# Patient Record
Sex: Male | Born: 1970 | Hispanic: Yes | Marital: Married | Smoking: Never smoker
Health system: Southern US, Community
[De-identification: ages and names within clinical notes are randomized; demographics above are authoritative.]

## PROBLEM LIST (undated history)

## (undated) DIAGNOSIS — J189 Pneumonia, unspecified organism: Secondary | ICD-10-CM

## (undated) DIAGNOSIS — E7800 Pure hypercholesterolemia, unspecified: Secondary | ICD-10-CM

## (undated) HISTORY — PX: NO PAST SURGERIES: SHX2092

---

## 2020-09-02 DIAGNOSIS — U071 COVID-19: Secondary | ICD-10-CM

## 2020-09-02 HISTORY — DX: COVID-19: U07.1

## 2021-09-25 ENCOUNTER — Emergency Department (HOSPITAL_COMMUNITY): Payer: No Typology Code available for payment source

## 2021-09-25 ENCOUNTER — Encounter (HOSPITAL_COMMUNITY): Payer: Self-pay

## 2021-09-25 ENCOUNTER — Other Ambulatory Visit: Payer: Self-pay

## 2021-09-25 ENCOUNTER — Observation Stay (HOSPITAL_COMMUNITY)
Admission: EM | Admit: 2021-09-25 | Discharge: 2021-09-27 | Disposition: A | Discharge status: Home / Self Care | Payer: No Typology Code available for payment source | Attending: General Surgery | Admitting: General Surgery

## 2021-09-25 DIAGNOSIS — S3991XA Unspecified injury of abdomen, initial encounter: Secondary | ICD-10-CM | POA: Insufficient documentation

## 2021-09-25 DIAGNOSIS — S2242XA Multiple fractures of ribs, left side, initial encounter for closed fracture: Secondary | ICD-10-CM | POA: Diagnosis not present

## 2021-09-25 DIAGNOSIS — S299XXA Unspecified injury of thorax, initial encounter: Secondary | ICD-10-CM | POA: Diagnosis present

## 2021-09-25 DIAGNOSIS — S82141A Displaced bicondylar fracture of right tibia, initial encounter for closed fracture: Secondary | ICD-10-CM | POA: Diagnosis not present

## 2021-09-25 DIAGNOSIS — S50312A Abrasion of left elbow, initial encounter: Secondary | ICD-10-CM | POA: Insufficient documentation

## 2021-09-25 DIAGNOSIS — Z20822 Contact with and (suspected) exposure to covid-19: Secondary | ICD-10-CM | POA: Diagnosis not present

## 2021-09-25 DIAGNOSIS — S2249XA Multiple fractures of ribs, unspecified side, initial encounter for closed fracture: Secondary | ICD-10-CM | POA: Diagnosis present

## 2021-09-25 DIAGNOSIS — S46812A Strain of other muscles, fascia and tendons at shoulder and upper arm level, left arm, initial encounter: Secondary | ICD-10-CM | POA: Insufficient documentation

## 2021-09-25 DIAGNOSIS — W132XXA Fall from, out of or through roof, initial encounter: Secondary | ICD-10-CM | POA: Insufficient documentation

## 2021-09-25 DIAGNOSIS — T1490XA Injury, unspecified, initial encounter: Secondary | ICD-10-CM

## 2021-09-25 DIAGNOSIS — Y9389 Activity, other specified: Secondary | ICD-10-CM | POA: Insufficient documentation

## 2021-09-25 DIAGNOSIS — Y9289 Other specified places as the place of occurrence of the external cause: Secondary | ICD-10-CM | POA: Diagnosis not present

## 2021-09-25 DIAGNOSIS — Y990 Civilian activity done for income or pay: Secondary | ICD-10-CM | POA: Diagnosis not present

## 2021-09-25 DIAGNOSIS — M25569 Pain in unspecified knee: Secondary | ICD-10-CM

## 2021-09-25 LAB — I-STAT CHEM 8, ED
BUN: 17 mg/dL (ref 6–20)
Calcium, Ion: 1.02 mmol/L — ABNORMAL LOW (ref 1.15–1.40)
Chloride: 102 mmol/L (ref 98–111)
Creatinine, Ser: 1.1 mg/dL (ref 0.61–1.24)
Glucose, Bld: 133 mg/dL — ABNORMAL HIGH (ref 70–99)
HCT: 44 % (ref 39.0–52.0)
Hemoglobin: 15 g/dL (ref 13.0–17.0)
Potassium: 3.2 mmol/L — ABNORMAL LOW (ref 3.5–5.1)
Sodium: 138 mmol/L (ref 135–145)
TCO2: 25 mmol/L (ref 22–32)

## 2021-09-25 LAB — CBC
HCT: 44.2 % (ref 39.0–52.0)
Hemoglobin: 14.7 g/dL (ref 13.0–17.0)
MCH: 29.5 pg (ref 26.0–34.0)
MCHC: 33.3 g/dL (ref 30.0–36.0)
MCV: 88.8 fL (ref 80.0–100.0)
Platelets: 269 10*3/uL (ref 150–400)
RBC: 4.98 MIL/uL (ref 4.22–5.81)
RDW: 12.8 % (ref 11.5–15.5)
WBC: 14.7 10*3/uL — ABNORMAL HIGH (ref 4.0–10.5)
nRBC: 0 % (ref 0.0–0.2)

## 2021-09-25 LAB — RESP PANEL BY RT-PCR (FLU A&B, COVID) ARPGX2
Influenza A by PCR: NEGATIVE
Influenza B by PCR: NEGATIVE
SARS Coronavirus 2 by RT PCR: NEGATIVE

## 2021-09-25 IMAGING — DX DG SHOULDER 2+V*L*
3 series · 3 of 3 positions shown · non-contrast
Comparison: None.

CLINICAL DATA: Left shoulder injury

EXAM:
LEFT SHOULDER - 2+ VIEW

[shoulder obl (1 of 2)]
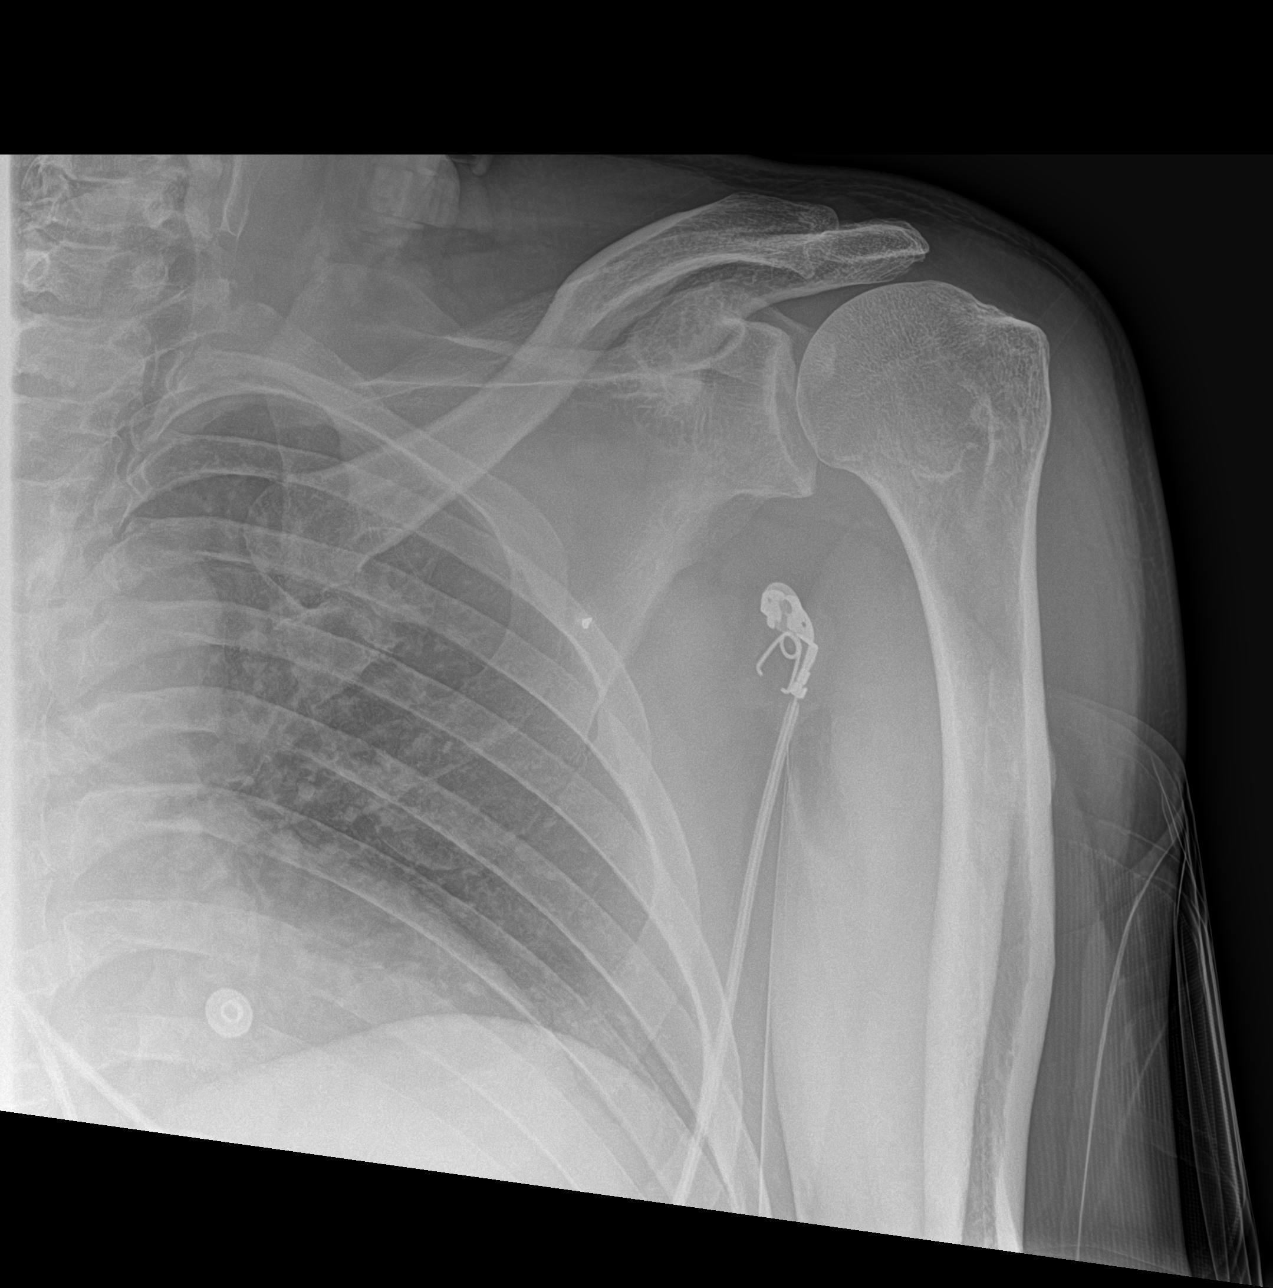

[shoulder ap]
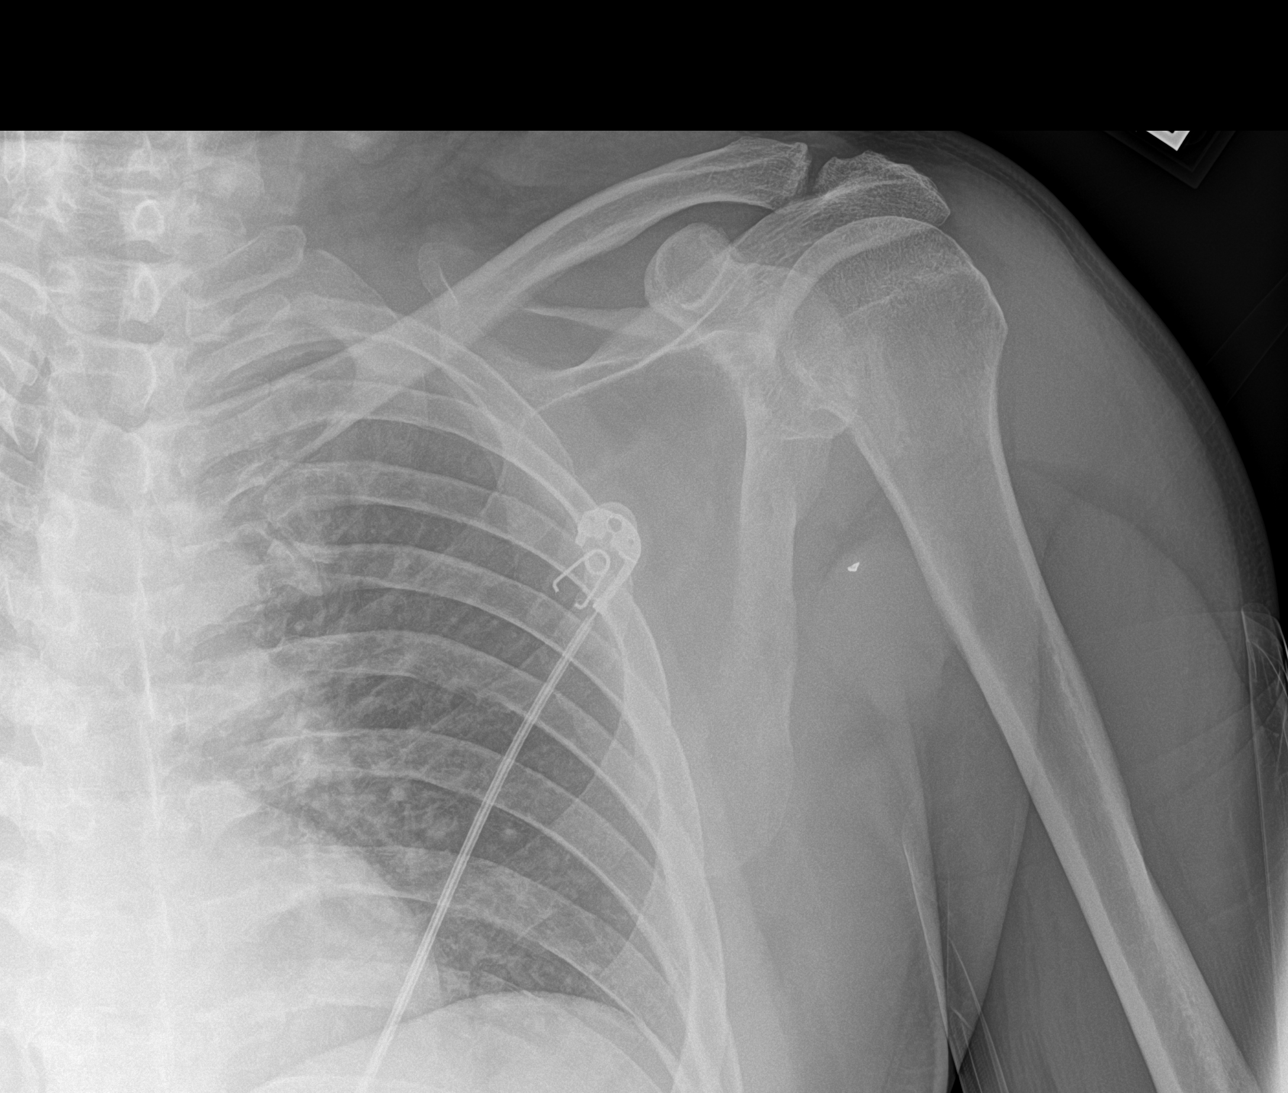

[shoulder obl (2 of 2)]
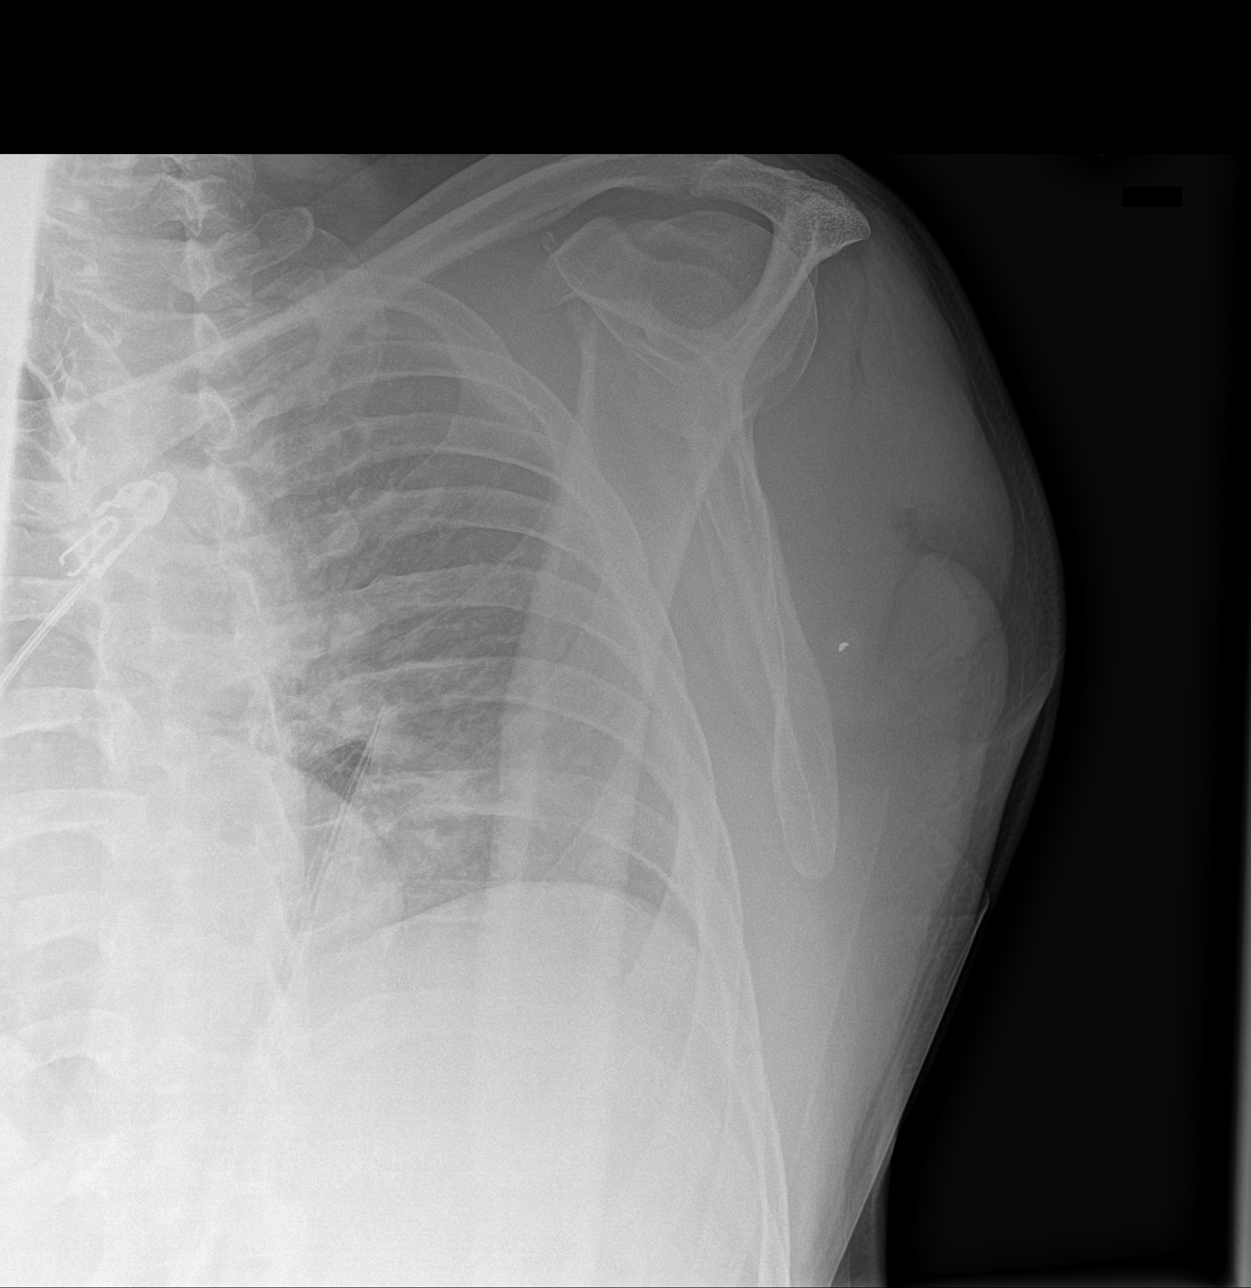

[3 of 3 positions shown; findings below may reference images not displayed]

FINDINGS: There is no evidence of fracture or dislocation. There is no
evidence of arthropathy or other focal bone abnormality. Soft
tissues are unremarkable.
IMPRESSION: No acute osseous abnormality identified.

## 2021-09-25 IMAGING — CT CT HEAD W/O CM
4 series · 16 of 47 positions shown, 18 images · non-contrast
Comparison: None.

CLINICAL DATA: Status post fall from 20 feet.



[Series 3: head without · axial · non-contrast · 0.45mm/px · z∈[-478,-348]mm · 7 of 36 slices shown, 9 images]
[im 5/36  brain]
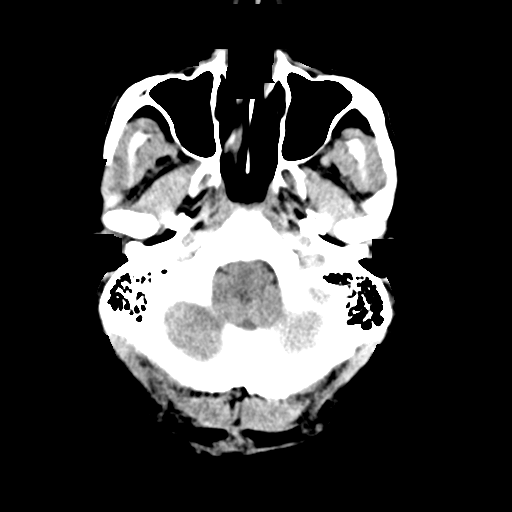
[im 5/36  bone]
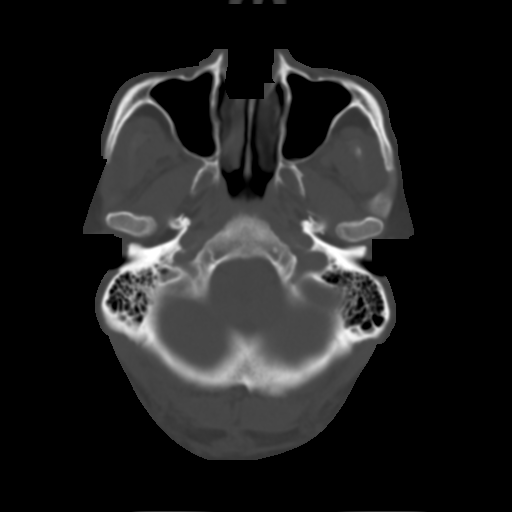
[im 9/36  brain]
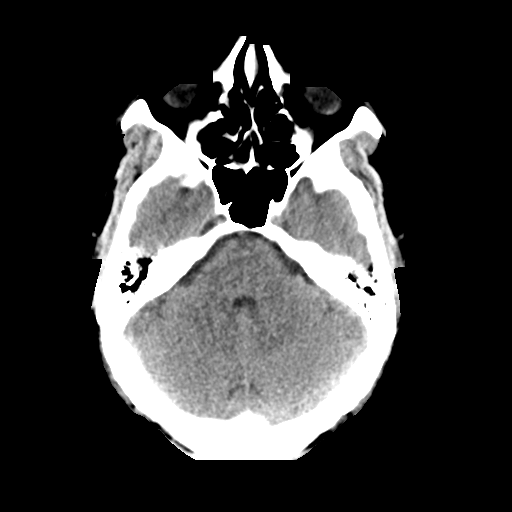
[im 14/36  brain]
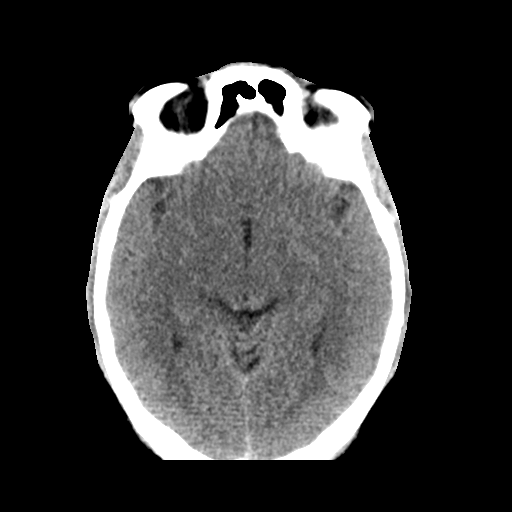
[im 18/36  brain]
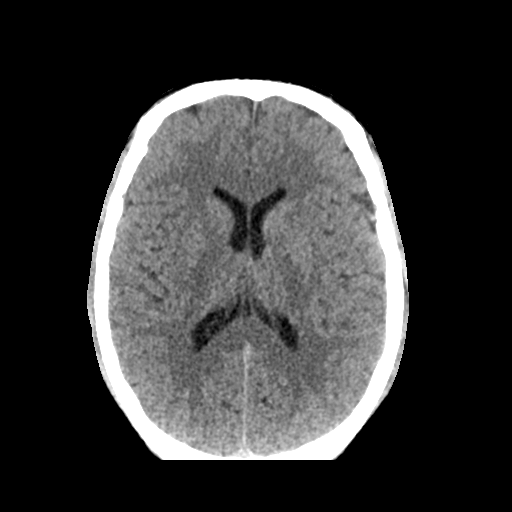
[im 22/36  brain]
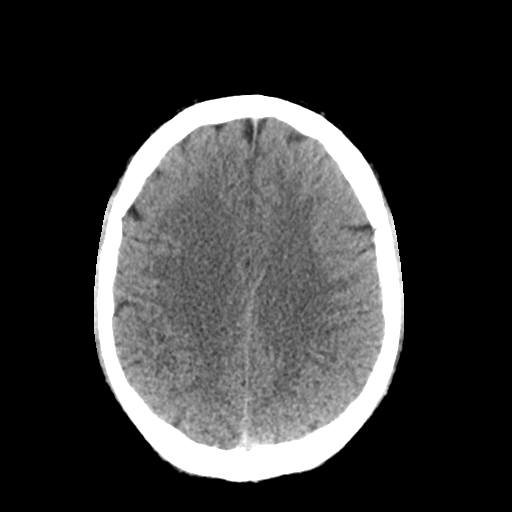
[im 22/36  bone]
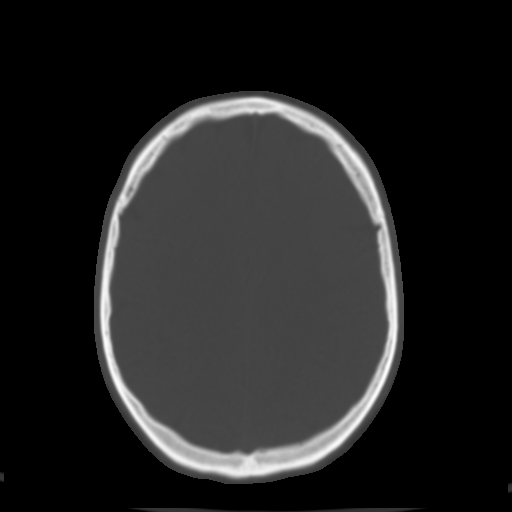
[im 27/36  brain]
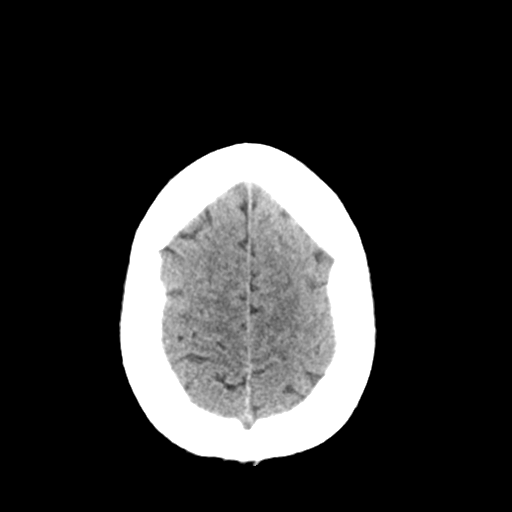
[im 31/36  brain]
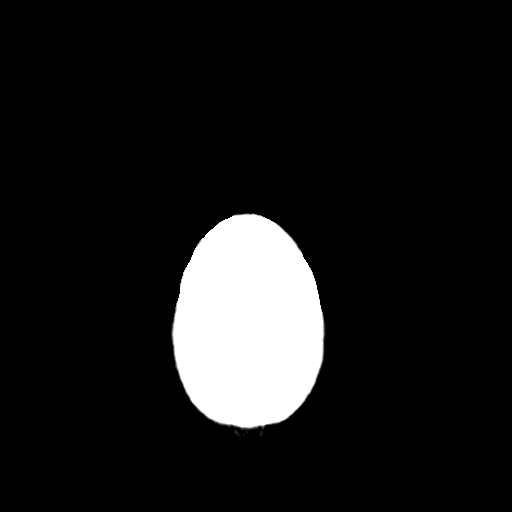

[Series 4: head bone · axial · 0.45mm/px · z∈[-482,-446]mm · 3 of 89 slices shown]
[im 9/89  bone]
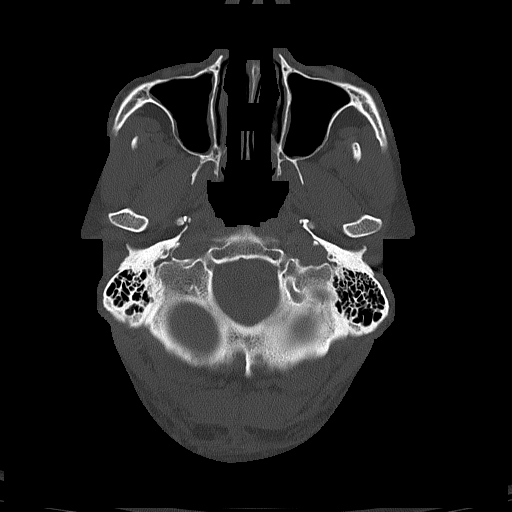
[im 18/89  bone]
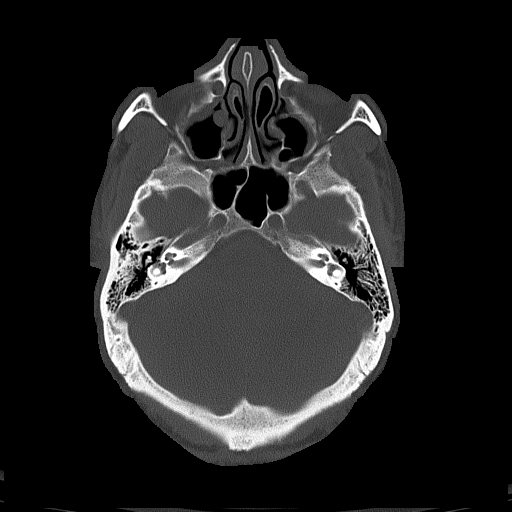
[im 27/89  bone]
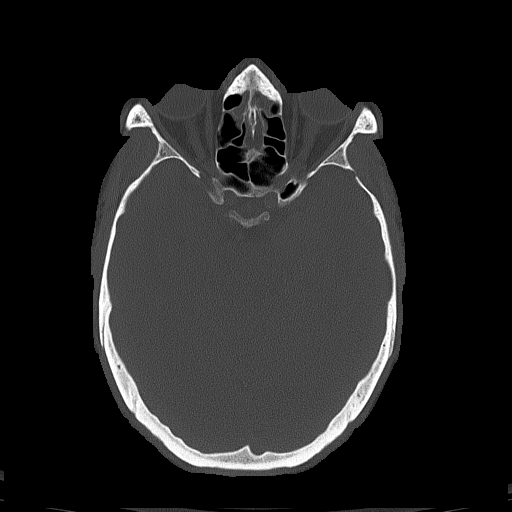

[Series 5: head without cor · coronal · non-contrast · 0.34mm/px · 3 of 72 slices shown]
[im 24/72  brain]
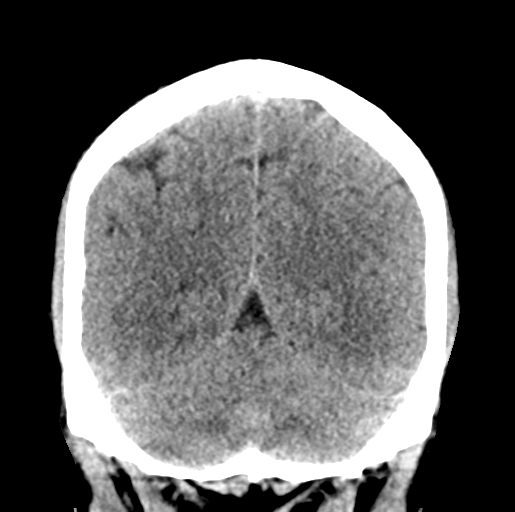
[im 32/72  brain]
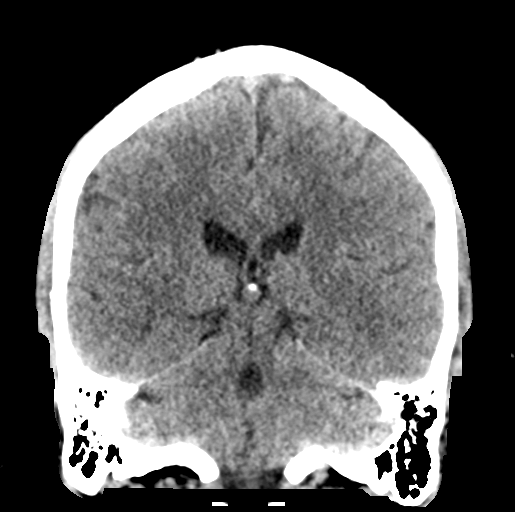
[im 40/72  brain]
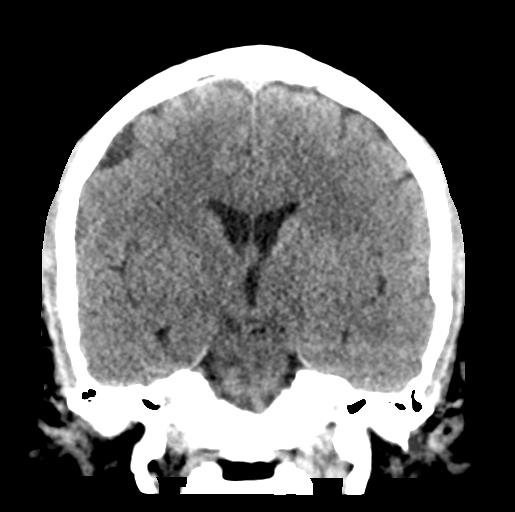

[Series 6: head without sag · sagittal · non-contrast · 0.34mm/px · 3 of 67 slices shown]
[im 23/67  brain]
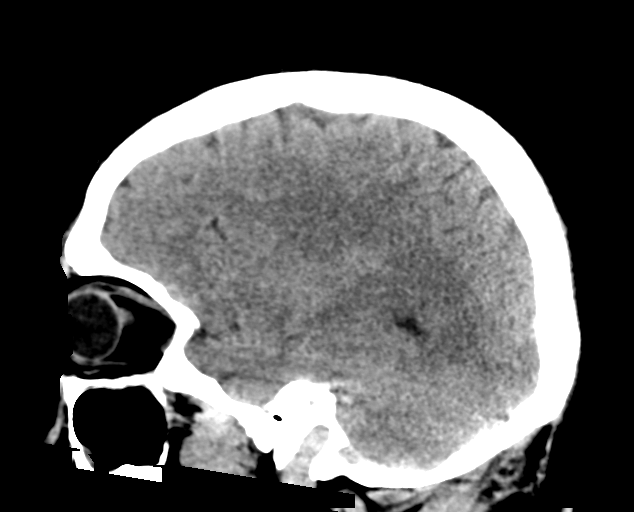
[im 34/67  brain]
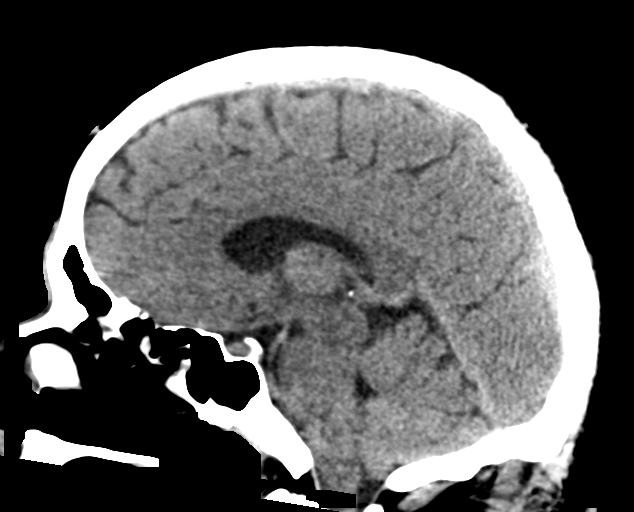
[im 45/67  brain]
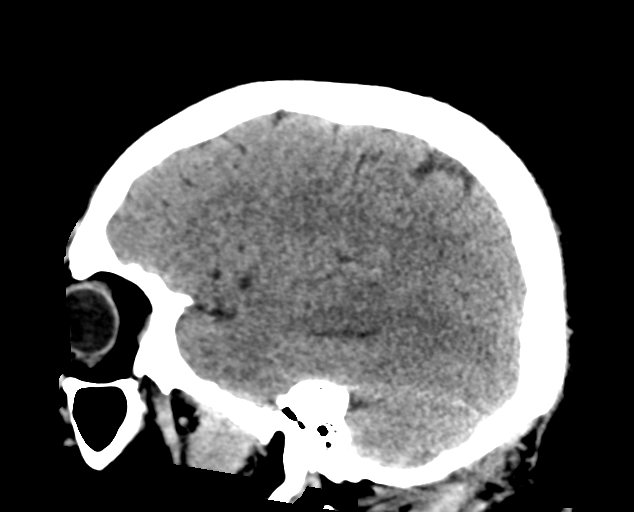

[16 of 47 positions shown; findings below may reference images not displayed]

FINDINGS: CT HEAD FINDINGS

Brain: No evidence of acute infarction, hemorrhage, hydrocephalus,
extra-axial collection or mass lesion/mass effect.

Vascular: No hyperdense vessel or unexpected calcification.

Skull: No osseous abnormality.

Sinuses/Orbits: Mucosal thickening of bilateral maxillary sinuses
and ethmoid sinuses. Visualized mastoid sinuses are clear.
Visualized orbits demonstrate no focal abnormality.

Other: None

CT CERVICAL SPINE FINDINGS

Alignment: Normal.

Skull base and vertebrae: No acute fracture. No primary bone lesion
or focal pathologic process.

Soft tissues and spinal canal: No prevertebral fluid or swelling. No
visible canal hematoma.

Disc levels: Degenerative disease with disc height loss at C4-5,
C5-6 and C6-7 with bilateral uncovertebral degenerative changes and
foraminal narrowing.

Upper chest: Lung apices are clear.

Other: No fluid collection or hematoma.
IMPRESSION: 1. No acute intracranial pathology.
2.  No acute osseous injury of the cervical spine.

## 2021-09-25 IMAGING — CT CT CHEST-ABD-PELV W/ CM
2 of 5 series · 12 of 36 positions shown, 14 images · IV contrast (APPLIED)
Comparison: None.

CLINICAL DATA: Abdominal trauma, blunt abdominal trauma in a
51-year-old male. Fall 20 feet by report.

EXAM:
CT CHEST, ABDOMEN, AND PELVIS WITH CONTRAST
TECHNIQUE: Multidetector CT imaging of the chest, abdomen and pelvis was
performed following the standard protocol during bolus
administration of intravenous contrast.

[Series 3: cap 5.0 i31f 2 · axial · 0.82mm/px · z∈[+786,+1356]mm · 9 of 144 slices shown, 11 images]
[im 15/144  mediastinal]
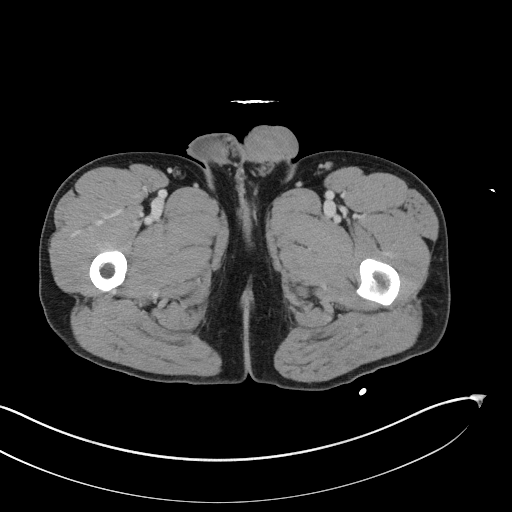
[im 15/144  bone]
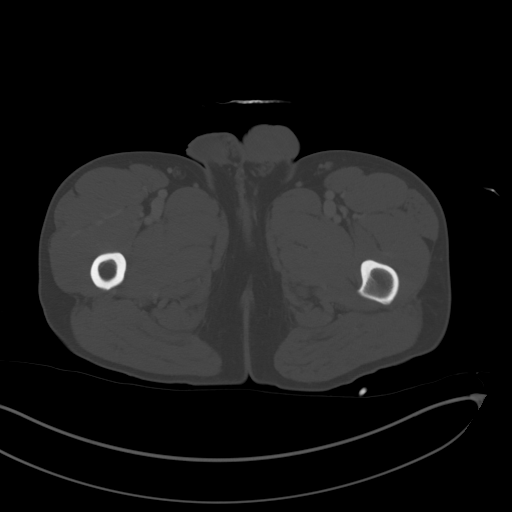
[im 29/144  mediastinal]
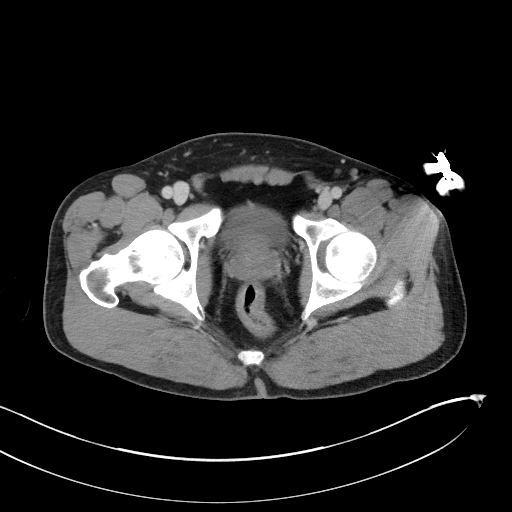
[im 43/144  mediastinal]
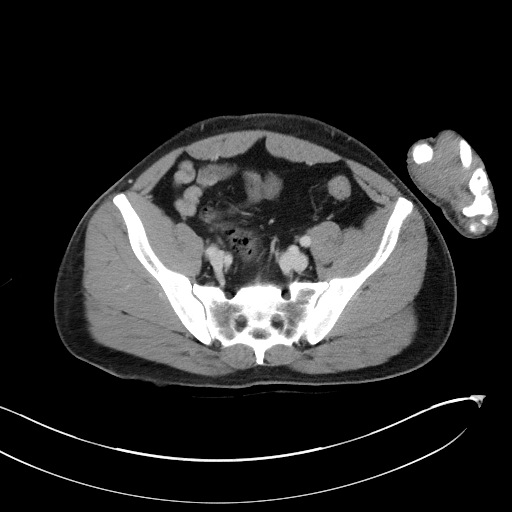
[im 58/144  mediastinal]
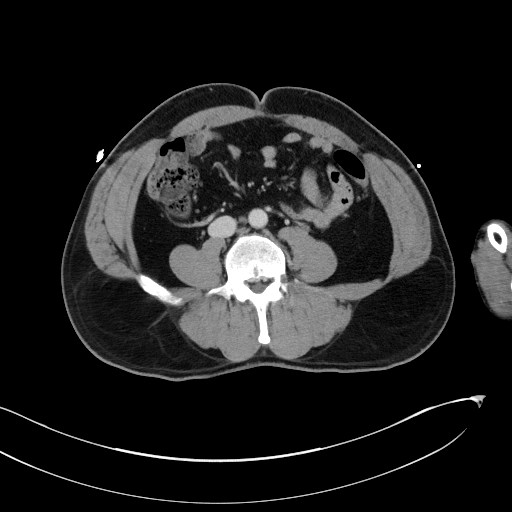
[im 72/144  mediastinal]
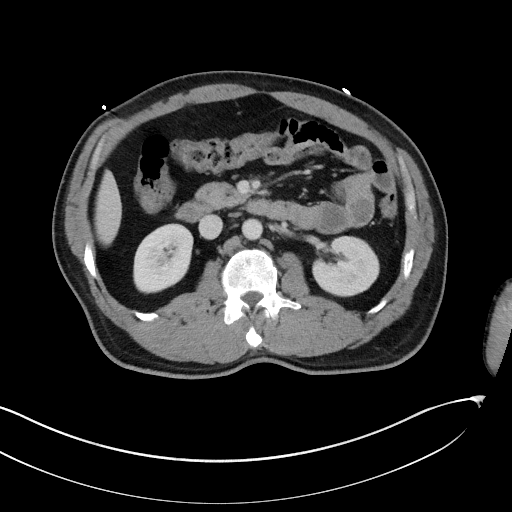
[im 86/144  mediastinal]
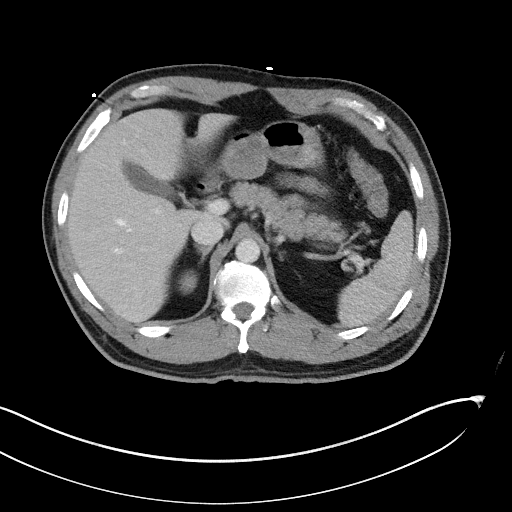
[im 101/144  mediastinal]
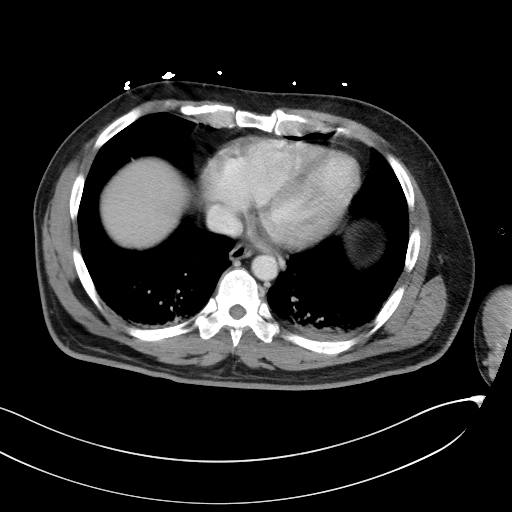
[im 115/144  mediastinal]
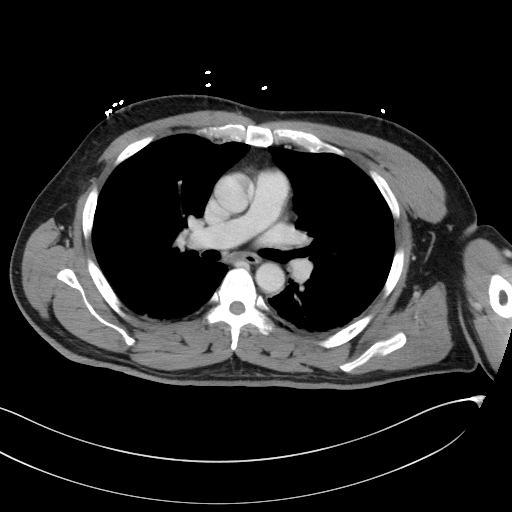
[im 129/144  mediastinal]
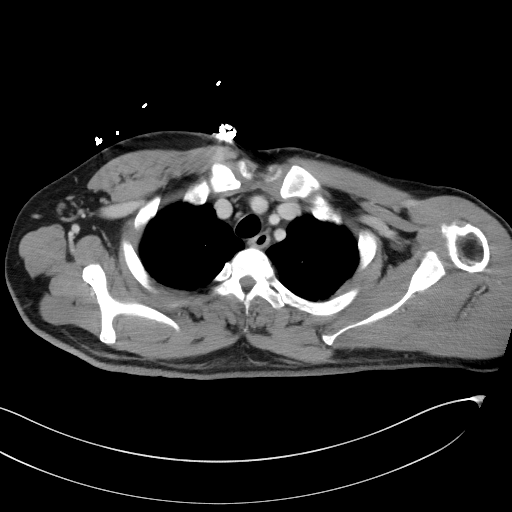
[im 129/144  bone]
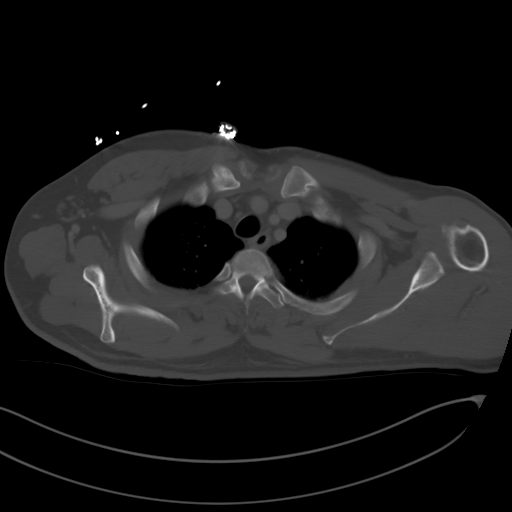

[Series 6: coronal · coronal · 0.71mm/px · 3 of 151 slices shown]
[im 31/151  mediastinal]
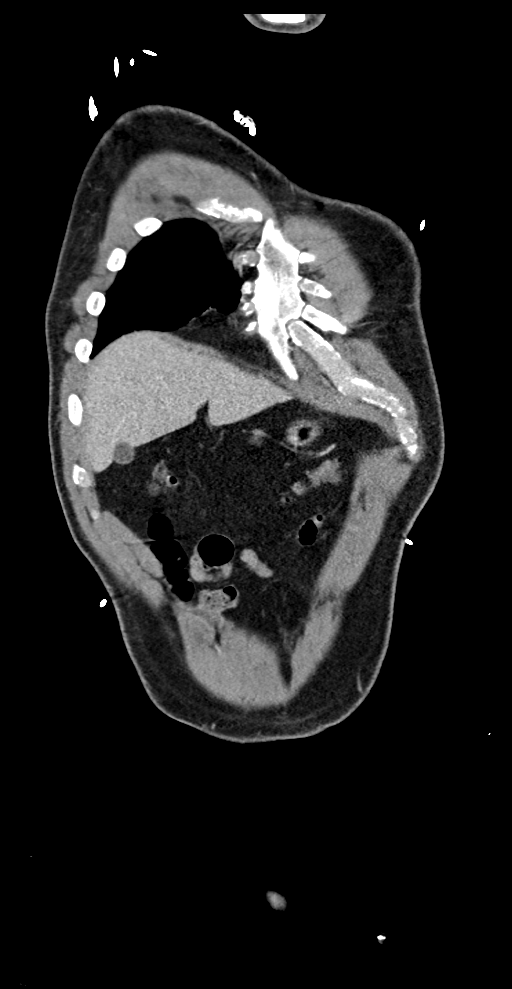
[im 61/151  mediastinal]
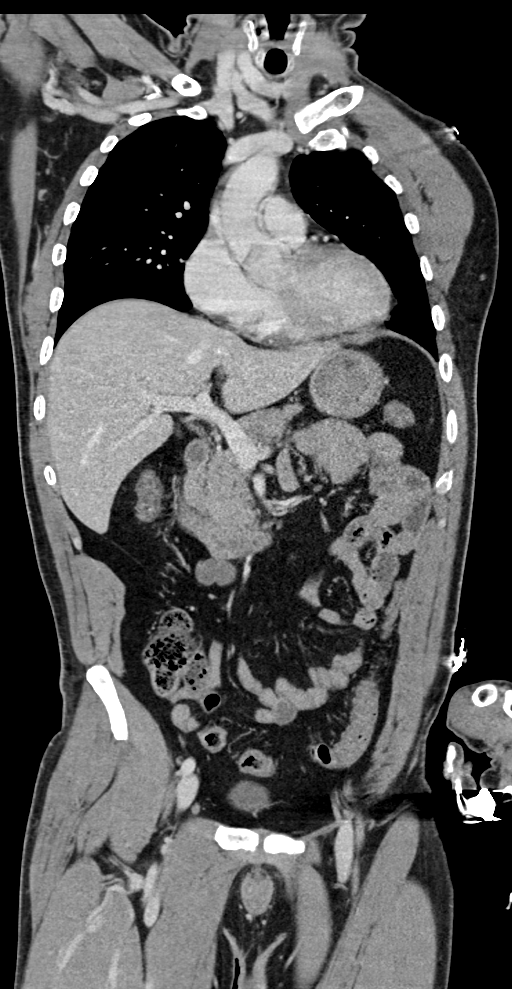
[im 91/151  mediastinal]
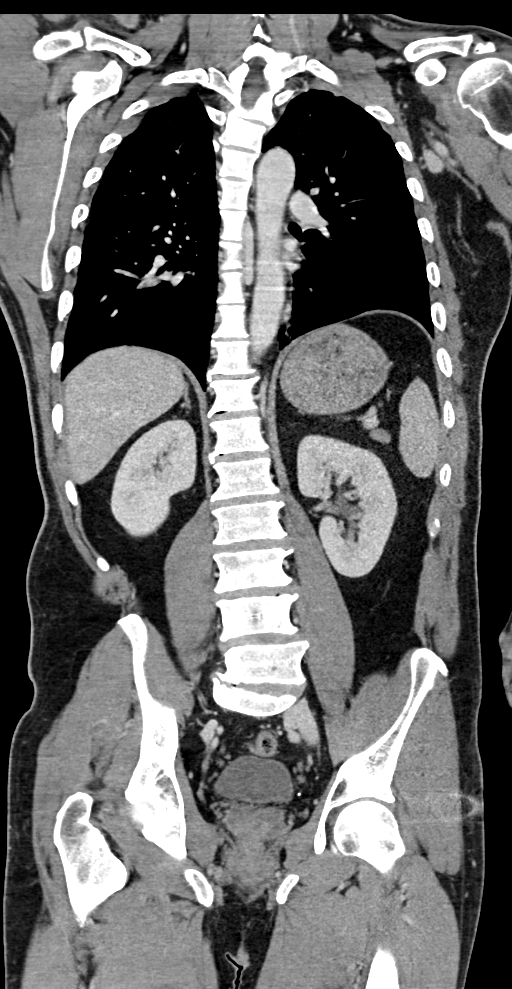

[12 of 36 positions shown; findings below may reference images not displayed]

RADIATION DOSE REDUCTION: This exam was performed according to the
departmental dose-optimization program which includes automated
exposure control, adjustment of the mA and/or kV according to
patient size and/or use of iterative reconstruction technique.

CONTRAST:  100mL OMNIPAQUE IOHEXOL 350 MG/ML SOLN
FINDINGS: CT CHEST FINDINGS

Cardiovascular: Heart size is normal without pericardial effusion.
Aortic caliber is normal. Contour of the thoracic aorta is smooth.
No periaortic stranding.

Central pulmonary vasculature unremarkable on venous phase.

Mediastinum/Nodes: Thoracic inlet structures are normal. No
mediastinal hematoma. No adenopathy in the chest.

Lungs/Pleura: Tiny LEFT anterior and medial pneumothorax. No
consolidation or evidence of pleural effusion. Airways are patent.
Minimal volume loss at the lung bases

Musculoskeletal: See below for full musculoskeletal details. No
substantial body wall contusion.

CT ABDOMEN PELVIS FINDINGS

Hepatobiliary: Liver with smooth contours. No signs of hepatic
trauma or focal, suspicious hepatic lesion. Portal vein is patent.
No pericholecystic stranding or signs of biliary duct distension.

Pancreas: Normal, without mass, inflammation or ductal dilatation.

Spleen: Spleen with smooth contours aside from small cleft in the
cephalad spleen. No perisplenic stranding or signs of splenic
laceration. No perisplenic fluid.

Adrenals/Urinary Tract:

Adrenal glands are unremarkable. Symmetric renal enhancement. No
sign of hydronephrosis. No suspicious renal lesion or perinephric
stranding.

Urinary bladder is grossly unremarkable.

Stomach/Bowel: No acute gastrointestinal process. The appendix is
normal

Vascular/Lymphatic: Aorta with smooth contours. No stranding
adjacent to the aorta. No adenopathy in the abdomen. No adenopathy
in the pelvis

Reproductive: Unremarkable by CT.

Other: No free intraperitoneal air. No free intra-abdominal fluid or
hemoperitoneum.

Musculoskeletal: LEFT-sided rib fractures involving ribs 7, 8 and 9
posteriorly without substantial displacement. LEFT seventh rib
fracture is segmental with a lateral fracture also noted. No signs
of costochondral injury. Sternum is intact. No displaced fractures
of RIGHT-sided ribs.

Visualized clavicles and scapulae are intact.

No fracture of the bony pelvis

Spinal degenerative changes.  Sternum is intact.
IMPRESSION: 1. Tiny LEFT anterior and medial pneumothorax.
2. LEFT-sided rib fractures involving ribs 7, 8 and 9 posteriorly
without substantial displacement. LEFT seventh rib fracture is
segmental as described.
3. No signs of acute traumatic injury to the abdomen or pelvis.

These results were called by telephone at the time of interpretation
on [DATE] at [DATE] to provider Dr. M ROED, Who verbally
acknowledged these results.

## 2021-09-25 IMAGING — DX DG CHEST 1V PORT
1 series · 1 of 1 positions shown · non-contrast
Comparison: None.

CLINICAL DATA: Trauma, chest pain

EXAM:
PORTABLE CHEST 1 VIEW

[chest ap]
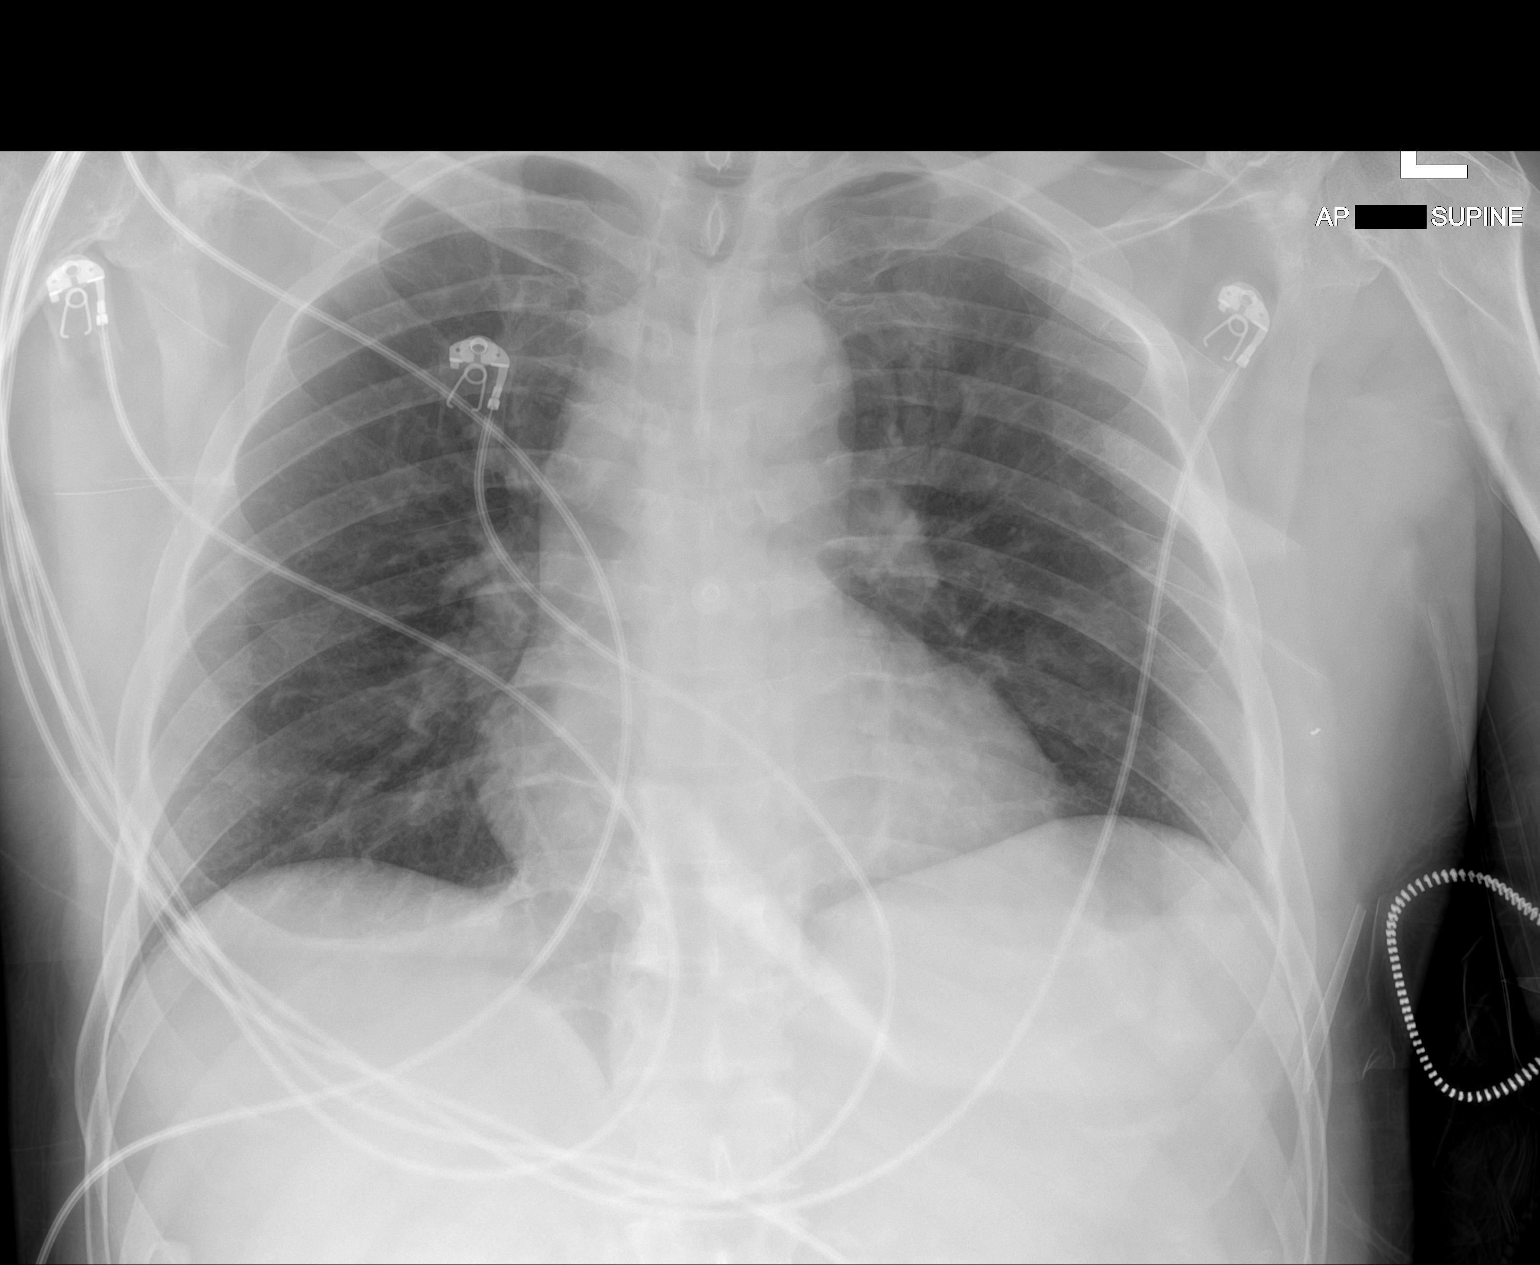

[1 of 1 positions shown; findings below may reference images not displayed]

FINDINGS: Heart size and mediastinal contours are within normal limits. No
suspicious pulmonary opacities identified.

No pleural effusion or pneumothorax visualized.

No acute osseous abnormality appreciated.
IMPRESSION: No acute intrathoracic process identified.

## 2021-09-25 IMAGING — DX DG ELBOW 2V*L*
2 series · 2 of 2 positions shown · non-contrast
Comparison: None.

CLINICAL DATA: Left elbow injury

EXAM:
LEFT ELBOW - 2 VIEW

[elbow ap]
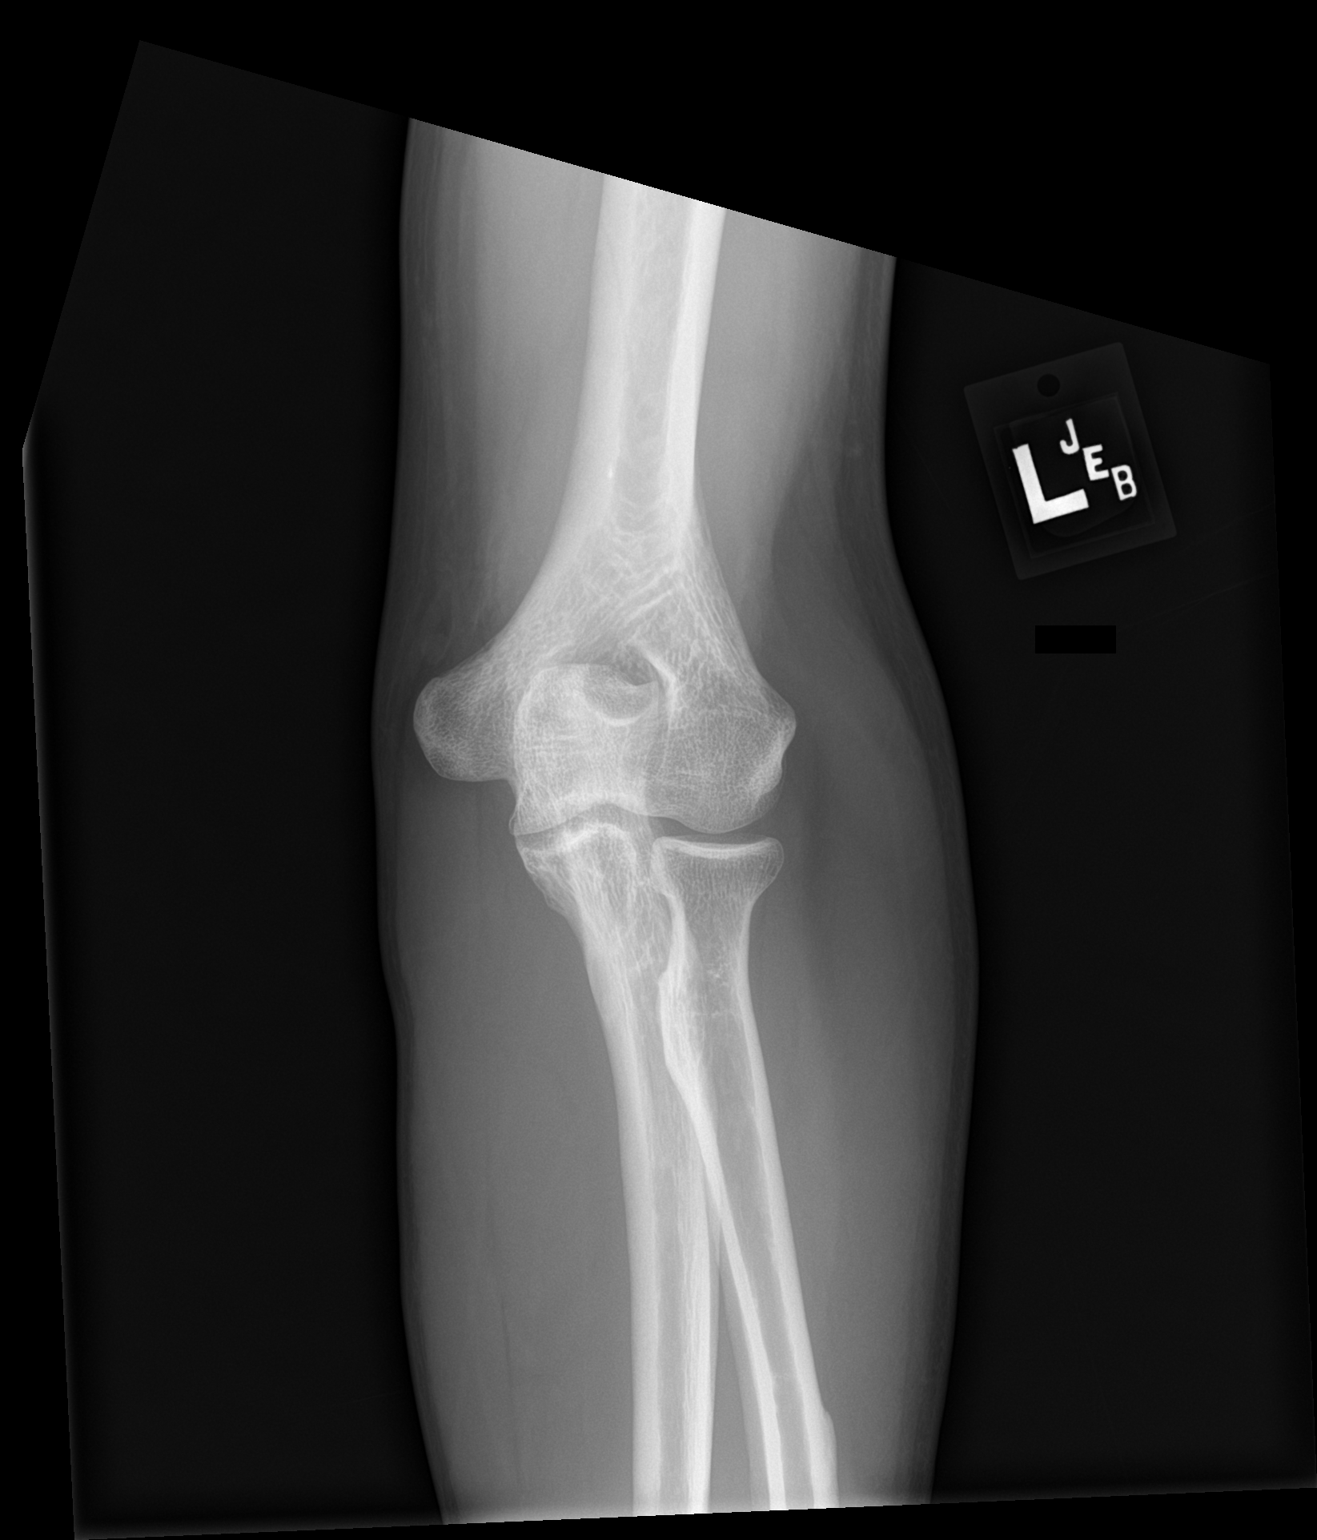

[elbow lat]
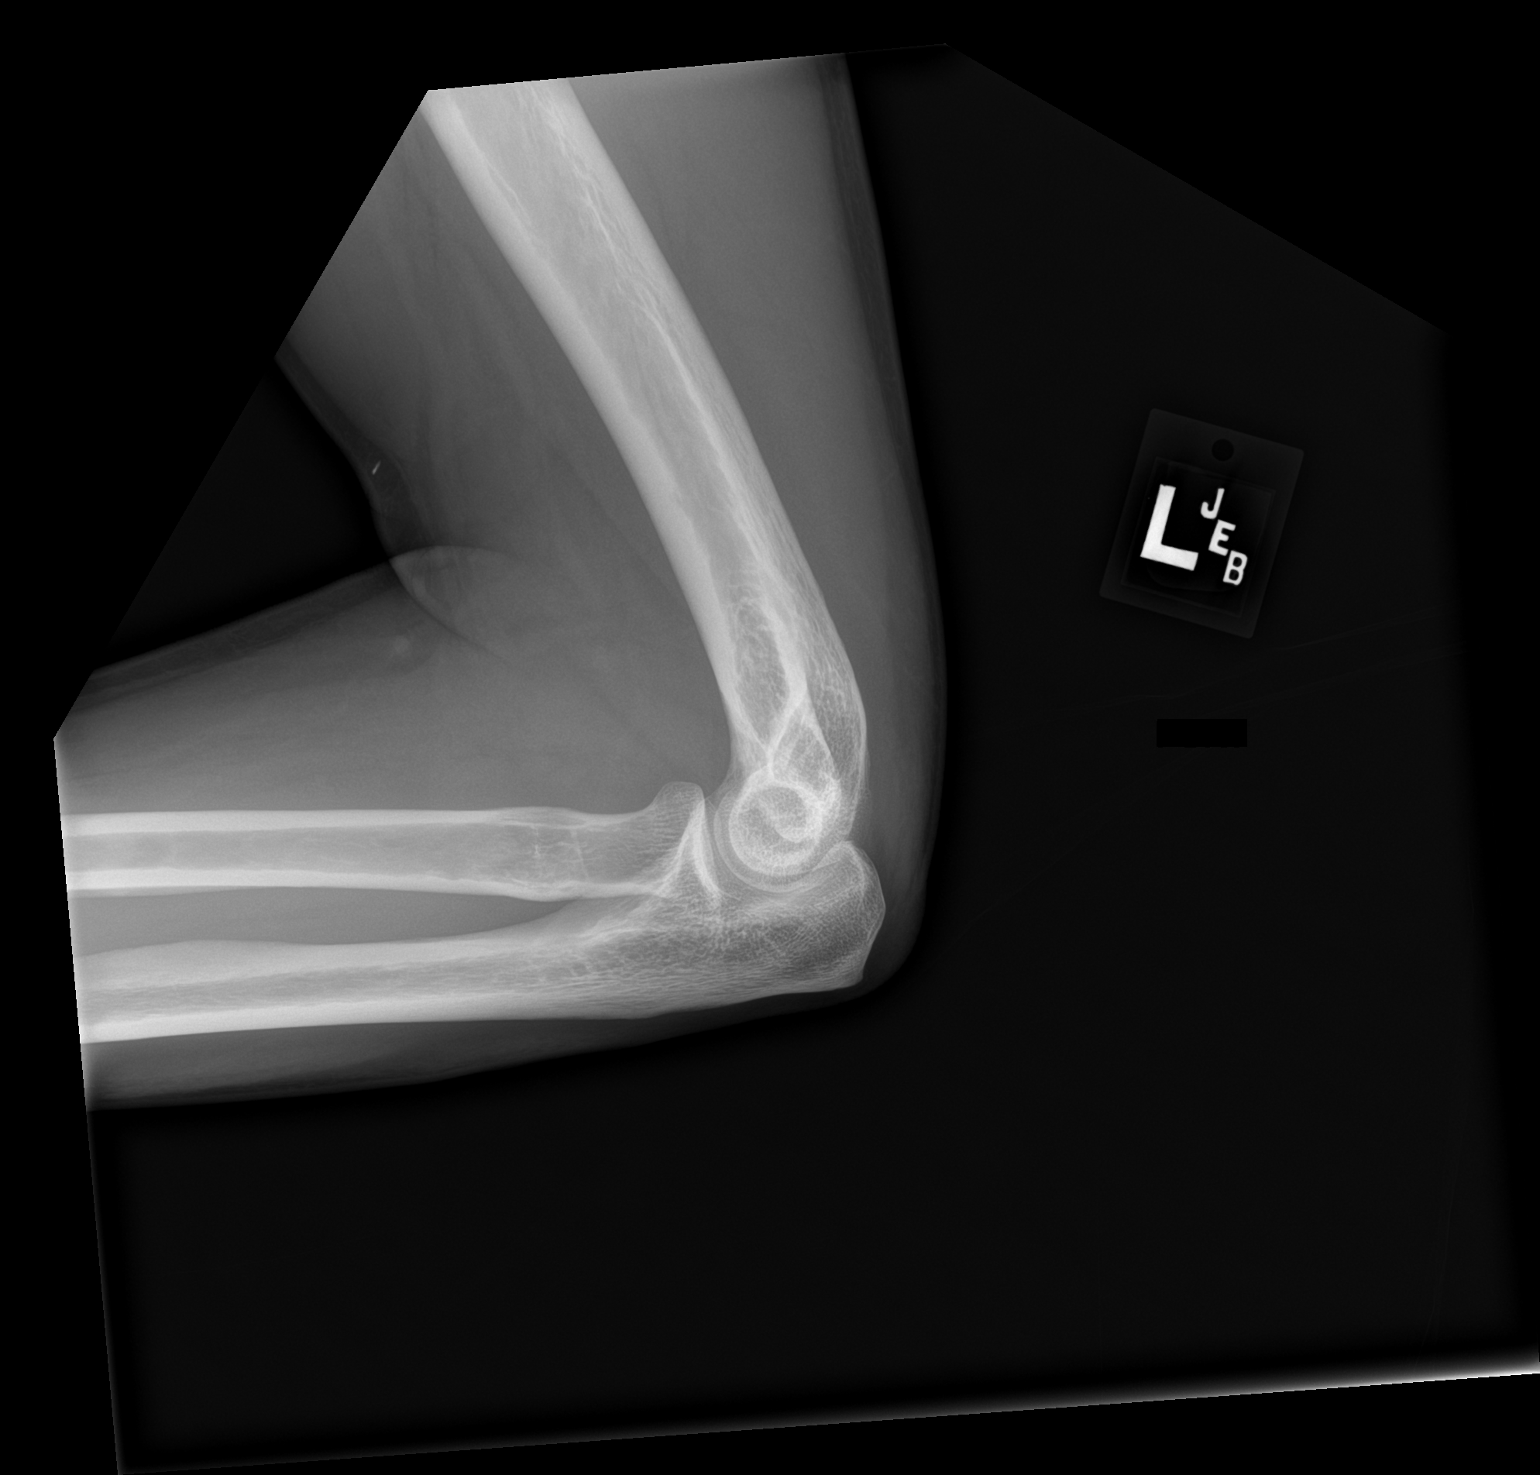

[2 of 2 positions shown; findings below may reference images not displayed]

FINDINGS: There is no evidence of fracture, dislocation, or joint effusion.
There is no evidence of arthropathy or other focal bone abnormality.
Soft tissues are unremarkable.
IMPRESSION: No acute osseous abnormality identified.

## 2021-09-25 IMAGING — CT CT CERVICAL SPINE W/O CM
4 of 8 series · 12 of 33 positions shown, 13 images · non-contrast
Comparison: None.

CLINICAL DATA: Status post fall from 20 feet.



[Series 5: c_spine 2.0 st · axial · 0.23mm/px · z∈[-590,-506]mm · 4 of 71 slices shown, 5 images]
[im 15/71  soft-tissue]
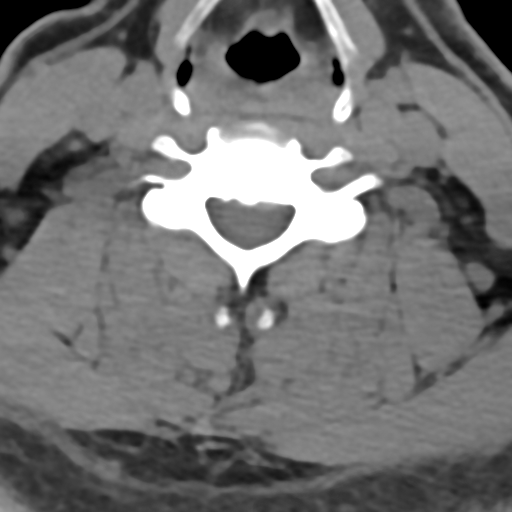
[im 15/71  bone]
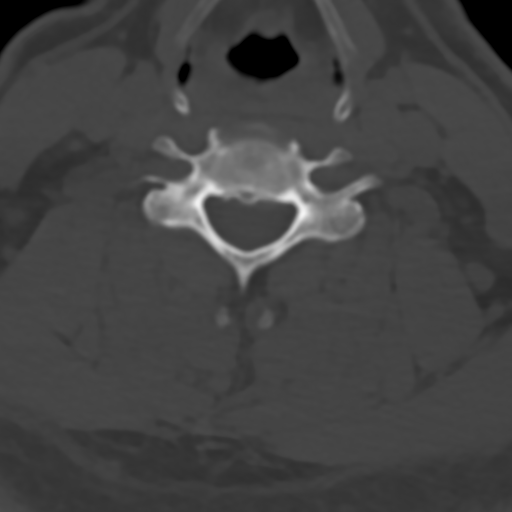
[im 29/71  bone]
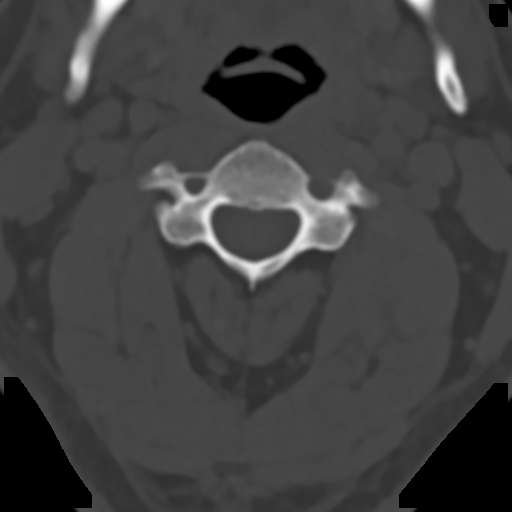
[im 43/71  bone]
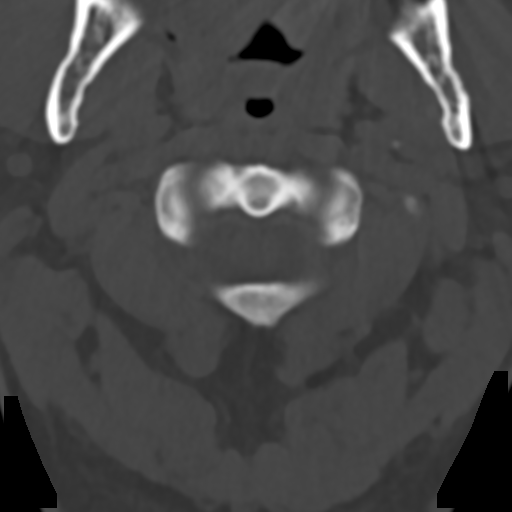
[im 57/71  bone]
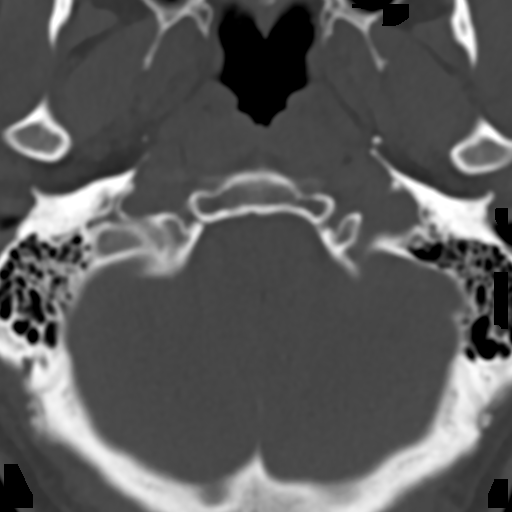

[Series 9: c_spine 2.0 sag bone · sagittal · 0.23mm/px · 5 of 61 slices shown]
[im 11/61  bone]
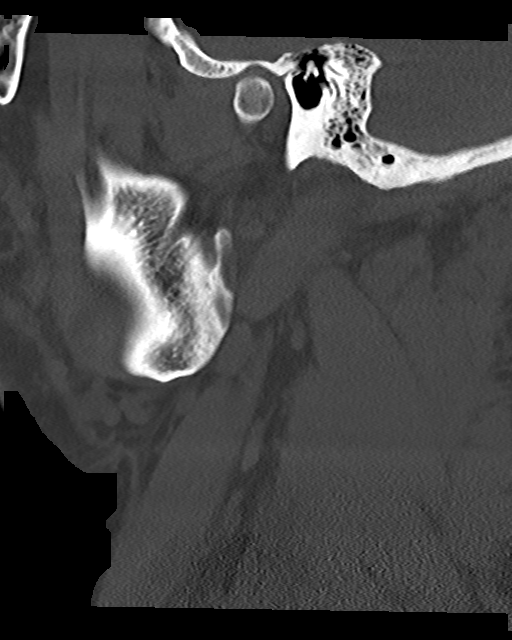
[im 21/61  bone]
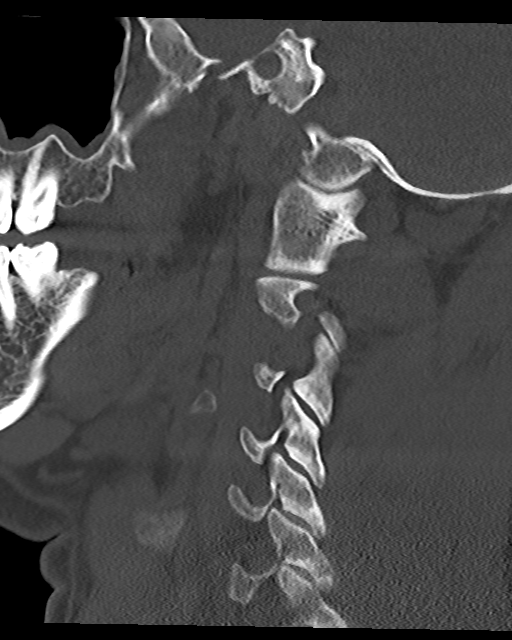
[im 31/61  bone]
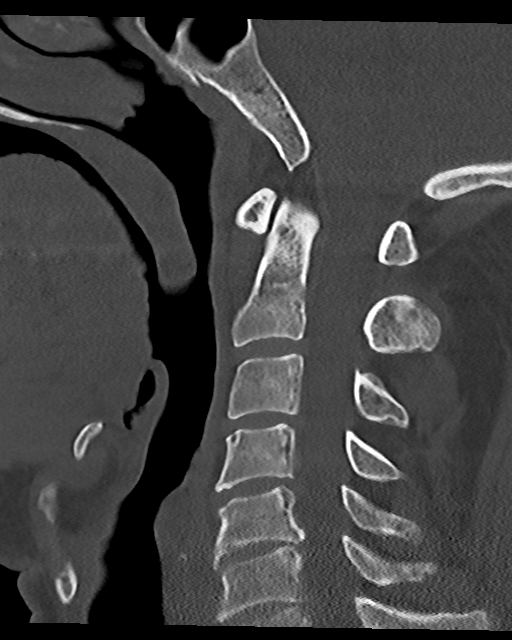
[im 41/61  bone]
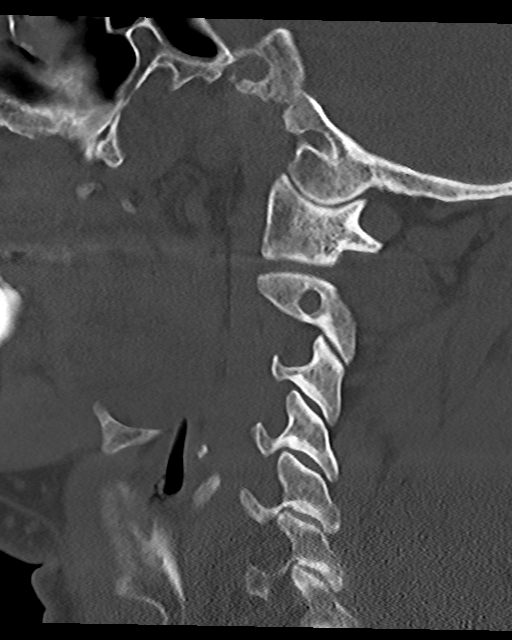
[im 51/61  bone]
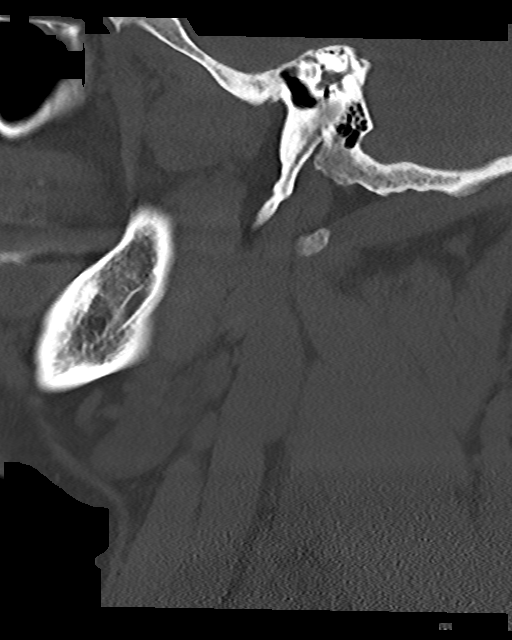

[Series 12: c_spine 2.0 orthogonals · axial · 0.21mm/px · z∈[-583,-540]mm · 2 of 60 slices shown]
[im 20/60  bone]
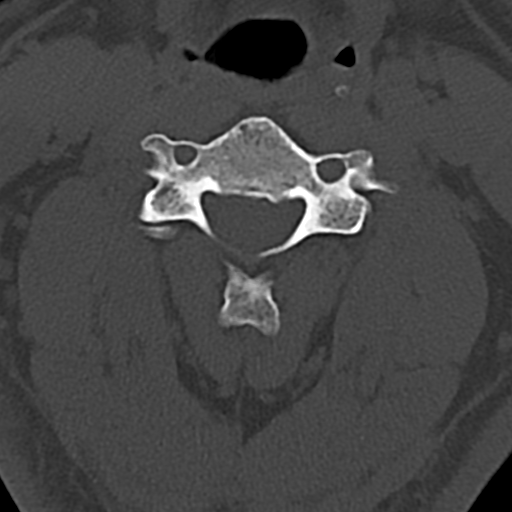
[im 40/60  bone]
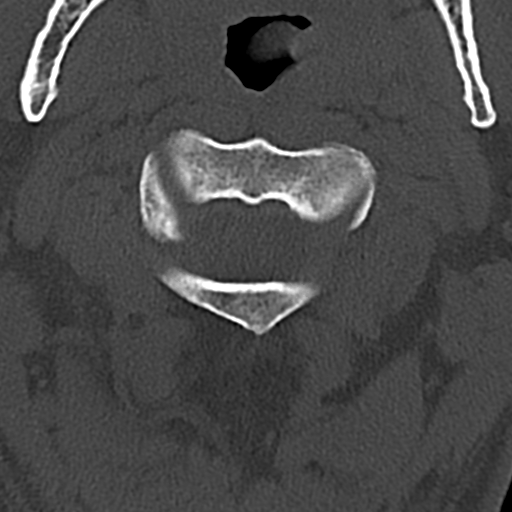

[Series 14: c_spine 2.0 cor bone · coronal · 0.16mm/px · 1 of 64 slices shown]
[im 32/64  bone]
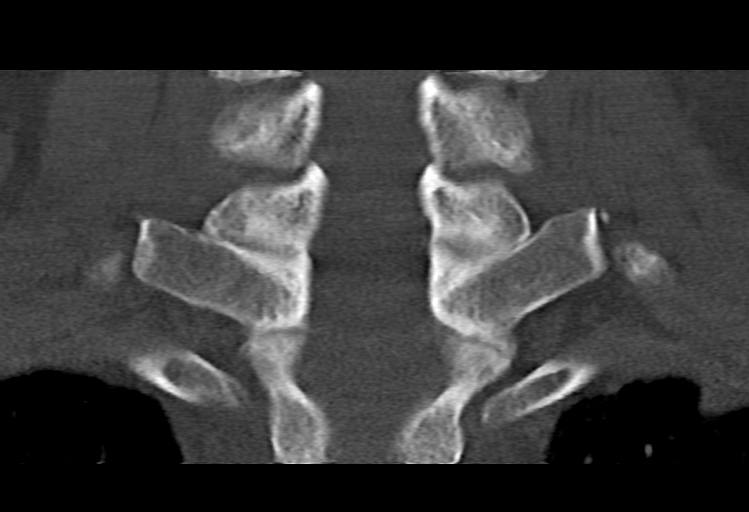

[12 of 33 positions shown; findings below may reference images not displayed]

FINDINGS: CT HEAD FINDINGS

Brain: No evidence of acute infarction, hemorrhage, hydrocephalus,
extra-axial collection or mass lesion/mass effect.

Vascular: No hyperdense vessel or unexpected calcification.

Skull: No osseous abnormality.

Sinuses/Orbits: Mucosal thickening of bilateral maxillary sinuses
and ethmoid sinuses. Visualized mastoid sinuses are clear.
Visualized orbits demonstrate no focal abnormality.

Other: None

CT CERVICAL SPINE FINDINGS

Alignment: Normal.

Skull base and vertebrae: No acute fracture. No primary bone lesion
or focal pathologic process.

Soft tissues and spinal canal: No prevertebral fluid or swelling. No
visible canal hematoma.

Disc levels: Degenerative disease with disc height loss at C4-5,
C5-6 and C6-7 with bilateral uncovertebral degenerative changes and
foraminal narrowing.

Upper chest: Lung apices are clear.

Other: No fluid collection or hematoma.
IMPRESSION: 1. No acute intracranial pathology.
2.  No acute osseous injury of the cervical spine.

## 2021-09-25 IMAGING — DX DG KNEE 1-2V PORT*R*
2 series · 2 of 2 positions shown · non-contrast
Comparison: None.

CLINICAL DATA: Right knee injury.

EXAM:
PORTABLE RIGHT KNEE - 1-2 VIEW

[knee ap]
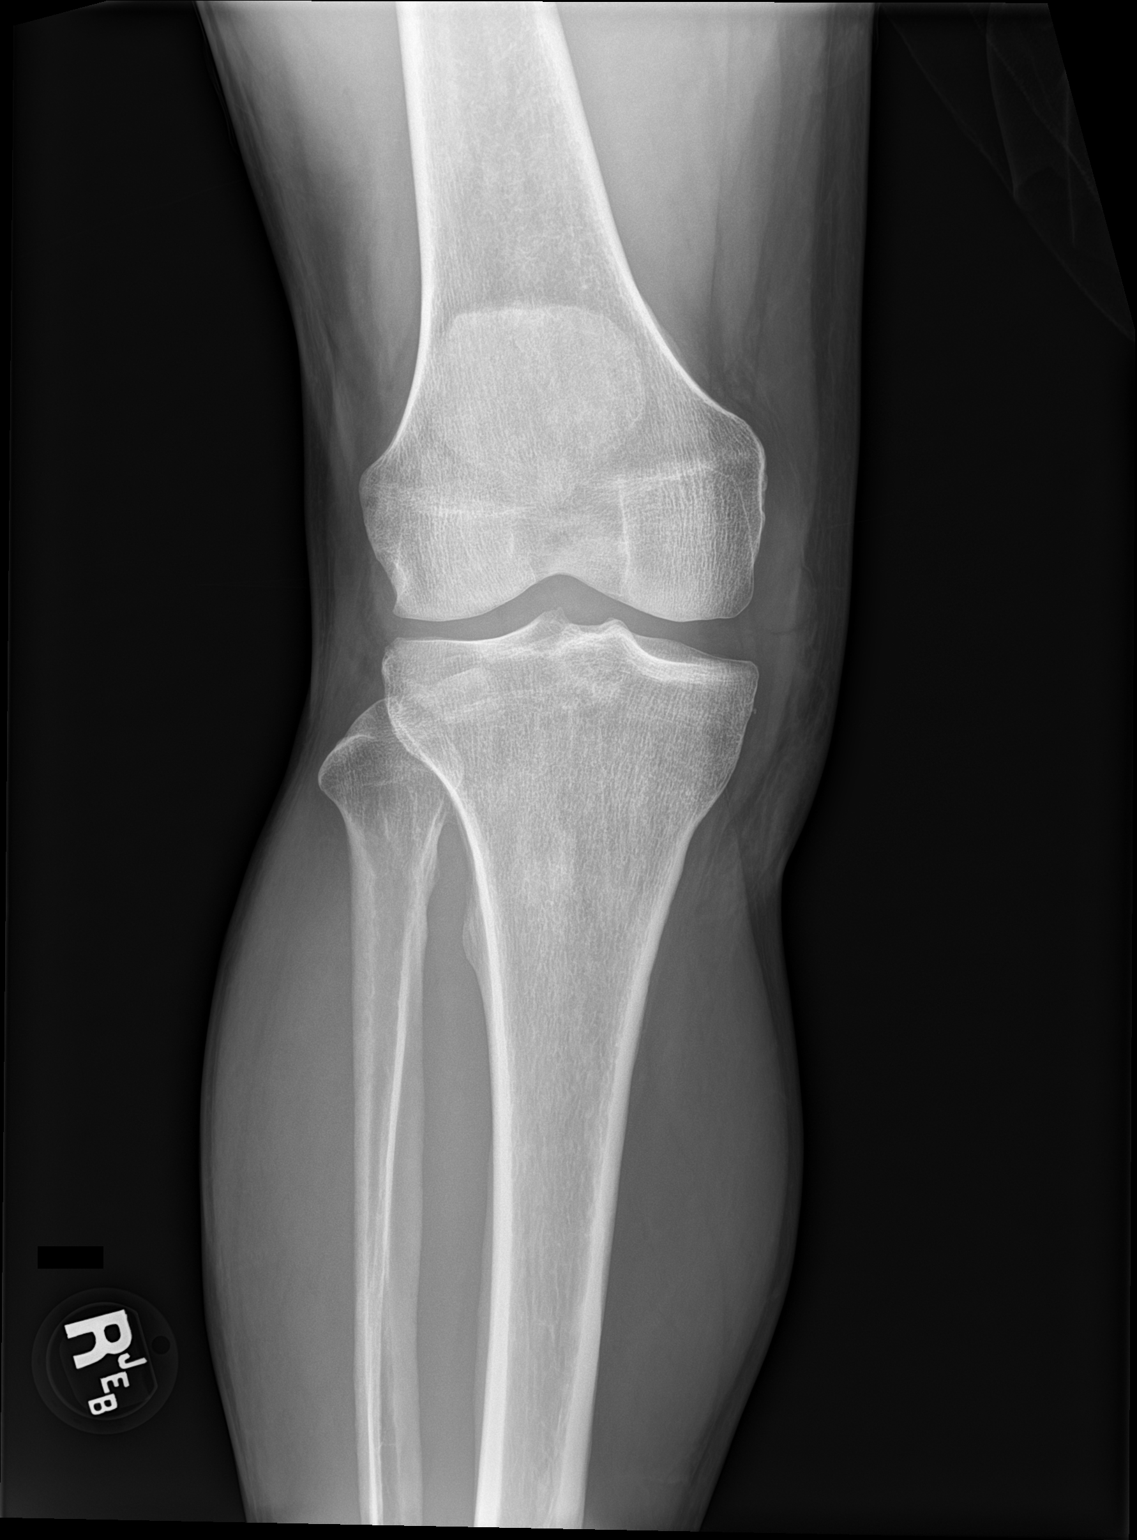

[knee lat]
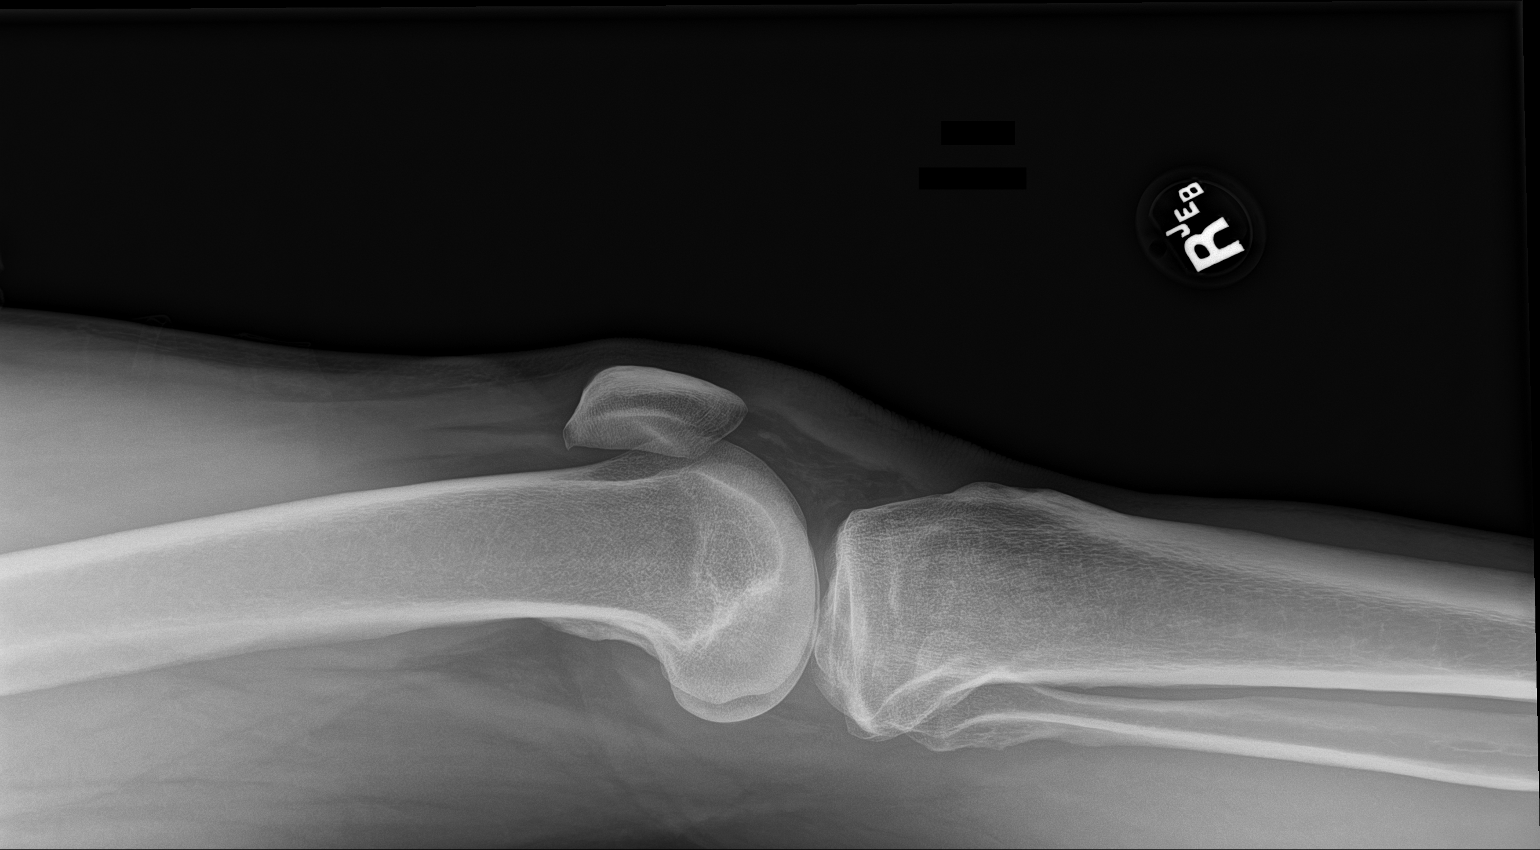

[2 of 2 positions shown; findings below may reference images not displayed]

FINDINGS: No acute fracture or dislocation identified. Joint spaces are
preserved. No large joint effusion.
IMPRESSION: No acute osseous abnormality identified.

## 2021-09-25 MED ORDER — ACETAMINOPHEN 325 MG PO TABS
650.0000 mg | ORAL_TABLET | Freq: Four times a day (QID) | ORAL | Status: DC
  Administered 2021-09-25 – 2021-09-27 (×8): 650 mg via ORAL
  Filled 2021-09-25 (×8): qty 2

## 2021-09-25 MED ORDER — DOCUSATE SODIUM 100 MG PO CAPS
100.0000 mg | ORAL_CAPSULE | Freq: Two times a day (BID) | ORAL | Status: DC
  Administered 2021-09-27: 100 mg via ORAL
  Filled 2021-09-25 (×3): qty 1

## 2021-09-25 MED ORDER — METOPROLOL TARTRATE 5 MG/5ML IV SOLN: 5.0000 mg | Freq: Four times a day (QID) | INTRAVENOUS | Status: DC | PRN

## 2021-09-25 MED ORDER — MORPHINE SULFATE (PF) 4 MG/ML IV SOLN
4.0000 mg | Freq: Once | INTRAVENOUS | Status: AC
  Administered 2021-09-25: 15:00:00 4 mg via INTRAVENOUS
  Filled 2021-09-25: qty 1

## 2021-09-25 MED ORDER — ONDANSETRON 4 MG PO TBDP: 4.0000 mg | ORAL_TABLET | Freq: Four times a day (QID) | ORAL | Status: DC | PRN

## 2021-09-25 MED ORDER — IBUPROFEN 800 MG PO TABS
800.0000 mg | ORAL_TABLET | Freq: Three times a day (TID) | ORAL | Status: DC
  Administered 2021-09-25 – 2021-09-27 (×6): 800 mg via ORAL
  Filled 2021-09-25 (×6): qty 1

## 2021-09-25 MED ORDER — HYDROMORPHONE HCL 1 MG/ML IJ SOLN
1.0000 mg | Freq: Once | INTRAMUSCULAR | Status: AC
  Administered 2021-09-25: 19:00:00 1 mg via INTRAVENOUS
  Filled 2021-09-25: qty 1

## 2021-09-25 MED ORDER — ONDANSETRON HCL 4 MG/2ML IJ SOLN: 4.0000 mg | Freq: Four times a day (QID) | INTRAMUSCULAR | Status: DC | PRN

## 2021-09-25 MED ORDER — HYDROMORPHONE HCL 1 MG/ML IJ SOLN: 0.5000 mg | INTRAMUSCULAR | Status: DC | PRN

## 2021-09-25 MED ORDER — METHOCARBAMOL 500 MG PO TABS
500.0000 mg | ORAL_TABLET | Freq: Four times a day (QID) | ORAL | Status: DC | PRN
  Administered 2021-09-27: 500 mg via ORAL
  Filled 2021-09-25: qty 1

## 2021-09-25 MED ORDER — GABAPENTIN 300 MG PO CAPS
300.0000 mg | ORAL_CAPSULE | Freq: Three times a day (TID) | ORAL | Status: DC
  Administered 2021-09-25 – 2021-09-27 (×6): 300 mg via ORAL
  Filled 2021-09-25 (×6): qty 1

## 2021-09-25 MED ORDER — HYDRALAZINE HCL 20 MG/ML IJ SOLN
10.0000 mg | INTRAMUSCULAR | Status: DC | PRN
  Filled 2021-09-25: qty 0.5

## 2021-09-25 MED ORDER — OXYCODONE HCL 5 MG PO TABS
5.0000 mg | ORAL_TABLET | ORAL | Status: DC | PRN
Start: 2021-09-25 — End: 2021-09-27
  Administered 2021-09-26 – 2021-09-27 (×4): 5 mg via ORAL
  Filled 2021-09-25 (×4): qty 1

## 2021-09-25 MED ORDER — ENOXAPARIN SODIUM 30 MG/0.3ML IJ SOSY
30.0000 mg | PREFILLED_SYRINGE | Freq: Two times a day (BID) | INTRAMUSCULAR | Status: DC
  Administered 2021-09-26 – 2021-09-27 (×3): 30 mg via SUBCUTANEOUS
  Filled 2021-09-25 (×4): qty 0.3

## 2021-09-25 MED ORDER — IOHEXOL 350 MG/ML SOLN
100.0000 mL | Freq: Once | INTRAVENOUS | Status: AC | PRN
  Administered 2021-09-25: 17:00:00 100 mL via INTRAVENOUS

## 2021-09-25 NOTE — H&P (Signed)
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  He landed on his left chest wall on a metal awning and then his right leg landed on cement below.  This occurred around 1:15 in the afternoon today.  Denies loss of consciousness or head injury.  Initially complained of left elbow, shoulder, ribs and right knee pain.  Continues to complain of the same.  Work-up as below demonstrates rib fractures with tiny anterior medial pneumothorax.  Trauma surgery consulted for admission.  No Known Allergies  History reviewed. No pertinent past medical history.  History reviewed. No pertinent surgical history.  History reviewed. No pertinent family history.  Social History   Socioeconomic History   Marital status: Not on file    Spouse name: Not on file   Number of children: Not on file   Years of education: Not on file   Highest education level: Not on file  Occupational History   Not on file  Tobacco Use   Smoking status: Never   Smokeless tobacco: Never  Substance and Sexual Activity   Alcohol use: Yes    Comment: occ   Drug use: Never   Sexual activity: Not on file  Other Topics Concern   Not on file  Social History Narrative   Not on file   Social Determinants of Health   Financial Resource Strain: Not on file  Food Insecurity: Not on file  Transportation Needs: Not on file  Physical Activity: Not on file  Stress: Not on file  Social Connections: Not on file    No current facility-administered medications on file prior to encounter.   No current outpatient medications on file prior to encounter.    Review of Systems: a complete, 10pt review of systems was completed with pertinent positives and negatives as documented in the HPI  Physical Exam: Vitals:   09/25/21 1730 09/25/21 1800  BP: (!) 146/87 125/80  Pulse: 82 91  Resp: (!) 22  (!) 32  Temp:    SpO2: 99% 97%   Gen: A&Ox3, no distress  Eyes: lids and conjunctivae normal, no icterus. Pupils equally round and reactive to light.  Neck: supple without mass or thyromegaly.  No C-spine tenderness.  Trachea midline, no crepitus or hematoma. Chest: respiratory effort is normal.  Tenderness along left chest wall as expected. Breath sounds equal.  Cardiovascular: RRR with palpable distal pulses, no pedal edema Gastrointestinal: soft, nondistended, nontender. No mass, hepatomegaly or splenomegaly. No hernia. Lymphatic: no lymphadenopathy in the neck or groin Muscoloskeletal: no clubbing or cyanosis of the fingers.  Tenderness without edema or deformity to the anterior left shoulder, pain with abduction; tenderness with mild swelling along the medial right knee, no deformity, able to flex and extend Neuro: cranial nerves grossly intact.  Sensation intact to light touch diffusely. Psych: appropriate mood and affect, normal insight/judgment intact  Skin: warm and dry   CBC Latest Ref Rng & Units 09/25/2021 09/25/2021  WBC 4.0 - 10.5 K/uL - 14.7(H)  Hemoglobin 13.0 - 17.0 g/dL 15.0 14.7  Hematocrit 39.0 - 52.0 % 44.0 44.2  Platelets 150 - 400 K/uL - 269    CMP Latest Ref Rng & Units 09/25/2021  Glucose 70 - 99 mg/dL 133(H)  BUN 6 - 20 mg/dL 17  Creatinine 0.61 - 1.24 mg/dL 1.10  Sodium 135 - 145 mmol/L 138  Potassium 3.5 -  5.1 mmol/L 3.2(L)  Chloride 98 - 111 mmol/L 102    No results found for: INR, PROTIME  Imaging: DG Elbow 2 Views Left  Result Date: 09/25/2021 CLINICAL DATA:  Left elbow injury EXAM: LEFT ELBOW - 2 VIEW COMPARISON:  None. FINDINGS: There is no evidence of fracture, dislocation, or joint effusion. There is no evidence of arthropathy or other focal bone abnormality. Soft tissues are unremarkable. IMPRESSION: No acute osseous abnormality identified. Electronically Signed   By: Ofilia Neas M.D.   On: 09/25/2021 15:05   CT Head Wo Contrast  Result  Date: 09/25/2021 CLINICAL DATA:  Status post fall from 20 feet. EXAM: CT HEAD WITHOUT CONTRAST CT CERVICAL SPINE WITHOUT CONTRAST TECHNIQUE: Multidetector CT imaging of the head and cervical spine was performed following the standard protocol without intravenous contrast. Multiplanar CT image reconstructions of the cervical spine were also generated. RADIATION DOSE REDUCTION: This exam was performed according to the departmental dose-optimization program which includes automated exposure control, adjustment of the mA and/or kV according to patient size and/or use of iterative reconstruction technique. COMPARISON:  None. FINDINGS: CT HEAD FINDINGS Brain: No evidence of acute infarction, hemorrhage, hydrocephalus, extra-axial collection or mass lesion/mass effect. Vascular: No hyperdense vessel or unexpected calcification. Skull: No osseous abnormality. Sinuses/Orbits: Mucosal thickening of bilateral maxillary sinuses and ethmoid sinuses. Visualized mastoid sinuses are clear. Visualized orbits demonstrate no focal abnormality. Other: None CT CERVICAL SPINE FINDINGS Alignment: Normal. Skull base and vertebrae: No acute fracture. No primary bone lesion or focal pathologic process. Soft tissues and spinal canal: No prevertebral fluid or swelling. No visible canal hematoma. Disc levels: Degenerative disease with disc height loss at C4-5, C5-6 and C6-7 with bilateral uncovertebral degenerative changes and foraminal narrowing. Upper chest: Lung apices are clear. Other: No fluid collection or hematoma. IMPRESSION: 1. No acute intracranial pathology. 2.  No acute osseous injury of the cervical spine. Electronically Signed   By: Kathreen Devoid M.D.   On: 09/25/2021 15:41   CT Cervical Spine Wo Contrast  Result Date: 09/25/2021 CLINICAL DATA:  Status post fall from 20 feet. EXAM: CT HEAD WITHOUT CONTRAST CT CERVICAL SPINE WITHOUT CONTRAST TECHNIQUE: Multidetector CT imaging of the head and cervical spine was performed  following the standard protocol without intravenous contrast. Multiplanar CT image reconstructions of the cervical spine were also generated. RADIATION DOSE REDUCTION: This exam was performed according to the departmental dose-optimization program which includes automated exposure control, adjustment of the mA and/or kV according to patient size and/or use of iterative reconstruction technique. COMPARISON:  None. FINDINGS: CT HEAD FINDINGS Brain: No evidence of acute infarction, hemorrhage, hydrocephalus, extra-axial collection or mass lesion/mass effect. Vascular: No hyperdense vessel or unexpected calcification. Skull: No osseous abnormality. Sinuses/Orbits: Mucosal thickening of bilateral maxillary sinuses and ethmoid sinuses. Visualized mastoid sinuses are clear. Visualized orbits demonstrate no focal abnormality. Other: None CT CERVICAL SPINE FINDINGS Alignment: Normal. Skull base and vertebrae: No acute fracture. No primary bone lesion or focal pathologic process. Soft tissues and spinal canal: No prevertebral fluid or swelling. No visible canal hematoma. Disc levels: Degenerative disease with disc height loss at C4-5, C5-6 and C6-7 with bilateral uncovertebral degenerative changes and foraminal narrowing. Upper chest: Lung apices are clear. Other: No fluid collection or hematoma. IMPRESSION: 1. No acute intracranial pathology. 2.  No acute osseous injury of the cervical spine. Electronically Signed   By: Kathreen Devoid M.D.   On: 09/25/2021 15:41   CT CHEST ABDOMEN PELVIS W CONTRAST  Result Date: 09/25/2021 CLINICAL DATA:  Abdominal trauma, blunt abdominal trauma in a 51 year old male. Fall 20 feet by report. EXAM: CT CHEST, ABDOMEN, AND PELVIS WITH CONTRAST TECHNIQUE: Multidetector CT imaging of the chest, abdomen and pelvis was performed following the standard protocol during bolus administration of intravenous contrast. RADIATION DOSE REDUCTION: This exam was performed according to the departmental  dose-optimization program which includes automated exposure control, adjustment of the mA and/or kV according to patient size and/or use of iterative reconstruction technique. CONTRAST:  192mL OMNIPAQUE IOHEXOL 350 MG/ML SOLN COMPARISON:  None. FINDINGS: CT CHEST FINDINGS Cardiovascular: Heart size is normal without pericardial effusion. Aortic caliber is normal. Contour of the thoracic aorta is smooth. No periaortic stranding. Central pulmonary vasculature unremarkable on venous phase. Mediastinum/Nodes: Thoracic inlet structures are normal. No mediastinal hematoma. No adenopathy in the chest. Lungs/Pleura: Tiny LEFT anterior and medial pneumothorax. No consolidation or evidence of pleural effusion. Airways are patent. Minimal volume loss at the lung bases Musculoskeletal: See below for full musculoskeletal details. No substantial body wall contusion. CT ABDOMEN PELVIS FINDINGS Hepatobiliary: Liver with smooth contours. No signs of hepatic trauma or focal, suspicious hepatic lesion. Portal vein is patent. No pericholecystic stranding or signs of biliary duct distension. Pancreas: Normal, without mass, inflammation or ductal dilatation. Spleen: Spleen with smooth contours aside from small cleft in the cephalad spleen. No perisplenic stranding or signs of splenic laceration. No perisplenic fluid. Adrenals/Urinary Tract: Adrenal glands are unremarkable. Symmetric renal enhancement. No sign of hydronephrosis. No suspicious renal lesion or perinephric stranding. Urinary bladder is grossly unremarkable. Stomach/Bowel: No acute gastrointestinal process. The appendix is normal Vascular/Lymphatic: Aorta with smooth contours. No stranding adjacent to the aorta. No adenopathy in the abdomen. No adenopathy in the pelvis Reproductive: Unremarkable by CT. Other: No free intraperitoneal air. No free intra-abdominal fluid or hemoperitoneum. Musculoskeletal: LEFT-sided rib fractures involving ribs 7, 8 and 9 posteriorly without  substantial displacement. LEFT seventh rib fracture is segmental with a lateral fracture also noted. No signs of costochondral injury. Sternum is intact. No displaced fractures of RIGHT-sided ribs. Visualized clavicles and scapulae are intact. No fracture of the bony pelvis Spinal degenerative changes.  Sternum is intact. IMPRESSION: 1. Tiny LEFT anterior and medial pneumothorax. 2. LEFT-sided rib fractures involving ribs 7, 8 and 9 posteriorly without substantial displacement. LEFT seventh rib fracture is segmental as described. 3. No signs of acute traumatic injury to the abdomen or pelvis. These results were called by telephone at the time of interpretation on 09/25/2021 at 5:29 pm to provider Dr. Dina Rich, Who verbally acknowledged these results. Electronically Signed   By: Zetta Bills M.D.   On: 09/25/2021 17:30   DG Chest Portable 1 View  Result Date: 09/25/2021 CLINICAL DATA:  Trauma, chest pain EXAM: PORTABLE CHEST 1 VIEW COMPARISON:  None. FINDINGS: Heart size and mediastinal contours are within normal limits. No suspicious pulmonary opacities identified. No pleural effusion or pneumothorax visualized. No acute osseous abnormality appreciated. IMPRESSION: No acute intrathoracic process identified. Electronically Signed   By: Ofilia Neas M.D.   On: 09/25/2021 15:06   DG Shoulder Left  Result Date: 09/25/2021 CLINICAL DATA:  Left shoulder injury EXAM: LEFT SHOULDER - 2+ VIEW COMPARISON:  None. FINDINGS: There is no evidence of fracture or dislocation. There is no evidence of arthropathy or other focal bone abnormality. Soft tissues are unremarkable. IMPRESSION: No acute osseous abnormality identified. Electronically Signed   By: Ofilia Neas M.D.   On: 09/25/2021 15:02   DG Knee Right Port  Result Date: 09/25/2021 CLINICAL DATA:  Right knee injury. EXAM: PORTABLE RIGHT KNEE - 1-2 VIEW COMPARISON:  None. FINDINGS: No acute fracture or dislocation identified. Joint spaces are preserved.  No large joint effusion. IMPRESSION: No acute osseous abnormality identified. Electronically Signed   By: Ofilia Neas M.D.   On: 09/25/2021 15:02     A/P: 51 year old status post fall -Left ribs 7 through 9 fractures with tiny pneumothorax: Multimodal pain control, repeat chest x-ray in the morning, aggressive pulmonary toilet -Left shoulder and right knee pain without fracture on imaging: Pain control and supportive care, possible Ortho consult for ligamentous exam in the morning if continuing to have pain and swelling      Romana Juniper, MD [facility], [place]  See AMION to contact appropriate on-call provider

## 2021-09-25 NOTE — ED Notes (Signed)
I instructed pt on how to use the incentive spirometer. Pt verbalized understanding and was able to demonstrate use of incentive spirometer. PT was able to get it up to 2200. I instructed pt to use 20 times an hour

## 2021-09-25 NOTE — ED Provider Notes (Signed)
Pt care assumed at 4pm.  Pt fell 20 foot off of a ladder. Pt has pain in his left chest and his left arm.  Pt has had ct head and ct c spine which are normal,  Pt reexamined by me,   PE  left chest tender, pain with deep breath Vitals stable  Ct shows multiple rib fractures and a small pneumothorax.   I spoke with Dr. Doylene Canard Trauma surgeon who will see pt here for evaltuion    Elson Areas, PA-C 09/25/21 1746    Horton, Clabe Seal, DO 09/25/21 2214

## 2021-09-25 NOTE — Progress Notes (Signed)
°   09/25/21 1400  Clinical Encounter Type  Visited With Patient not available  Visit Type Initial;Trauma  Referral From Nurse  Consult/Referral To None   Chaplain responded to a level 2 trauma alert. Medical team was providing patient care.  No family was present. I advised nurse to please call if a chaplain is requested.   [place] Resident  [facility]  [phone]

## 2021-09-25 NOTE — ED Provider Notes (Signed)
[facility] EMERGENCY DEPARTMENT Provider Note   CSN: [id number] Arrival date & time: 09/25/21  1418     History  No chief complaint on file.   Vincent Mccarthy is a 51 y.o. male who presents to the emergency department for further evaluation of a fall at 20 feet that occurred approximately 1 hour ago.  Patient states that he was up on the roof of a building using a sweeping machine when the machine kicked and pushed him back off the roof.  On his way down he struck a metal awning on the left side and then fell to the ground primarily landing on the right leg.  He is complaining of pain localized to the left lateral chest wall, left shoulder, left elbow, right knee.  It is unclear whether or not he hit his head.  He arrived via EMS.  HPI     Home Medications Prior to Admission medications   Not on File      Allergies    Patient has no allergy information on record.    Review of Systems   Review of Systems  All other systems reviewed and are negative.  Physical Exam Updated Vital Signs BP 124/84    Pulse (!) 102    Temp 98.6 F (37 C) (Oral)    Resp (!) 26    SpO2 97%  Physical Exam Vitals and nursing note reviewed.  Constitutional:      General: He is not in acute distress.    Appearance: Normal appearance.  HENT:     Head: Normocephalic and atraumatic.  Eyes:     General:        Right eye: No discharge.        Left eye: No discharge.     Pupils: Pupils are equal, round, and reactive to light.  Cardiovascular:     Pulses:          Radial pulses are 2+ on the right side and 2+ on the left side.       Dorsalis pedis pulses are 2+ on the right side and 2+ on the left side.     Comments: Regular rate and rhythm.  S1/S2 are distinct without any evidence of murmur, rubs, or gallops. No evidence of pedal edema. Pulmonary:     Comments: Clear to auscultation bilaterally.  Normal effort.  No respiratory distress.  No evidence of wheezes, rales, or rhonchi  heard throughout. Chest:     Comments: Chest wall is stable to palpation.  No evidence of flail chest.  Left lateral chest wall is tender to palpation.  No obvious step-offs or fractures. Abdominal:     General: Abdomen is flat. Bowel sounds are normal. There is no distension.     Tenderness: There is no abdominal tenderness. There is no guarding or rebound.  Musculoskeletal:        General: Normal range of motion.     Cervical back: No spinous process tenderness or muscular tenderness.     Comments: Left shoulder is tender to palpation.  Left elbow is tender palpation and does have a superficial abrasion.  No obvious deformities or step-offs on the left upper extremity.  Pelvis is stable and nontender.  Right knee has mild swelling and is mildly tender to palpation.  No obvious step-offs or deformities.  Left lower extremity is normal.  Cervical, thoracic, and lumbar spine is nontender to palpation.  Skin:    General: Skin is warm and dry.  Findings: No rash.     Comments: No obvious ecchymosis or bruising.  Neurological:     General: No focal deficit present.     Mental Status: He is alert.     GCS: GCS eye subscore is 4. GCS verbal subscore is 5. GCS motor subscore is 6.  Psychiatric:        Mood and Affect: Mood normal.        Behavior: Behavior normal.    ED Results / Procedures / Treatments   Labs (all labs ordered are listed, but only abnormal results are displayed) Labs Reviewed  CBC  I-STAT CHEM 8, ED    EKG None  Radiology No results found.  Procedures Procedures  Cardiac telemetry did reveal tachycardia.  Blood pressure is normal.  He is oxygenating well on room air.  Medications Ordered in ED Medications - No data to display  ED Course/ Medical Decision Making/ A&P                           Medical Decision Making Amount and/or Complexity of Data Reviewed Labs: ordered. Radiology: ordered.  Risk Prescription drug management. Decision regarding  hospitalization.   Vincent Mccarthy is a 51 y.o. male who presents the emergency department as a level 2 trauma secondary to a fall from 20 feet.  On arrival, patient was immediately placed into a treatment room where myself and my attending Dr. Karene Fry were present.  Primary survey was done promptly.  He answered all questions appropriately with a GCS of 15.  Airway was intact.  No obvious signs of bleeding or circulatory failure.  Secondary survey did not reveal any obvious fractures or dislocations.  Will obtain imaging and basic labs.  Patient was given 100 mcg of fentanyl prior to arrival for pain control.  I will reassess once we get back imaging.  I personally reviewed the imaging at the bedside which did not reveal any obvious fracture dislocations.  I agree with the radiologist interpretation of x-ray imaging.  CT head and cervical spine are pending.  I gave the patient 4 mg of morphine for additional analgesia control.  With the rest of his work-up still pending the rest of his care was transferred to [facility].   Final Clinical Impression(s) / ED Diagnoses Final diagnoses:  None    Rx / DC Orders ED Discharge Orders     None         Teressa Lower, [place] 09/27/21 [id number]    Ernie Avena, MD 09/27/21 1901

## 2021-09-25 NOTE — ED Triage Notes (Signed)
Pt arrived via [facility] for a 20 ft fall from roof. When pt fell from roof, his left ribs hit metal awning and right leg hit cement. Pt denies hitting head or LOC. Pt c/o left elbow, shoulder, ribs and right knee pain. Pt arrived on a back board and c-collar. EMS gave fentanyl . Pt is A&Ox4. VSS

## 2021-09-25 NOTE — Progress Notes (Signed)
Orthopedic Tech Progress Note Patient Details:  Vincent Mccarthy 01/07/1970 [id number]  Level 2 trauma   Patient ID: Vincent Mccarthy, male   DOB: 01/07/1970, 51 y.o.   MRN: [id number]  Vincent Mccarthy 09/25/2021, 2:40 PM

## 2021-09-26 ENCOUNTER — Encounter (HOSPITAL_COMMUNITY): Payer: Self-pay

## 2021-09-26 ENCOUNTER — Observation Stay (HOSPITAL_COMMUNITY): Payer: No Typology Code available for payment source

## 2021-09-26 LAB — CBC
HCT: 41.3 % (ref 39.0–52.0)
Hemoglobin: 14.4 g/dL (ref 13.0–17.0)
MCH: 30.2 pg (ref 26.0–34.0)
MCHC: 34.9 g/dL (ref 30.0–36.0)
MCV: 86.6 fL (ref 80.0–100.0)
Platelets: 232 10*3/uL (ref 150–400)
RBC: 4.77 MIL/uL (ref 4.22–5.81)
RDW: 12.9 % (ref 11.5–15.5)
WBC: 11.8 10*3/uL — ABNORMAL HIGH (ref 4.0–10.5)
nRBC: 0 % (ref 0.0–0.2)

## 2021-09-26 LAB — BASIC METABOLIC PANEL
Anion gap: 10 (ref 5–15)
BUN: 11 mg/dL (ref 6–20)
CO2: 24 mmol/L (ref 22–32)
Calcium: 8.7 mg/dL — ABNORMAL LOW (ref 8.9–10.3)
Chloride: 101 mmol/L (ref 98–111)
Creatinine, Ser: 0.82 mg/dL (ref 0.61–1.24)
GFR, Estimated: >60 mL/min (ref >60)
Glucose, Bld: 102 mg/dL — ABNORMAL HIGH (ref 70–99)
Potassium: 3.6 mmol/L (ref 3.5–5.1)
Sodium: 135 mmol/L (ref 135–145)

## 2021-09-26 LAB — HIV ANTIBODY (ROUTINE TESTING W REFLEX): HIV Screen 4th Generation wRfx: NONREACTIVE

## 2021-09-26 IMAGING — MR MR SHOULDER*L* W/O CM
5 series · 38 of 40 positions shown · non-contrast
Comparison: None.

CLINICAL DATA: Status post fall from the root. Shoulder pain.

EXAM:
MRI OF THE LEFT SHOULDER WITHOUT CONTRAST
TECHNIQUE: Multiplanar, multisequence MR imaging of the shoulder was performed.
No intravenous contrast was administered.

[Series 5: T2 fat-sat · axial · left · 4.0mm · 0.36mm/px · z∈[-168,-54]mm · 8 of 25 slices shown (1 of 3)]
[im 1/25]
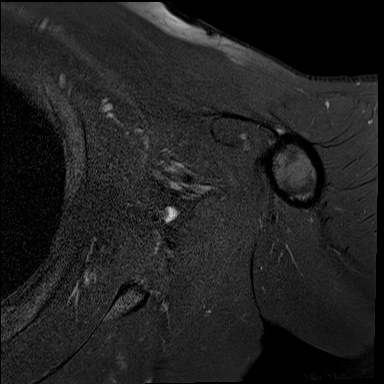
[im 3/25]
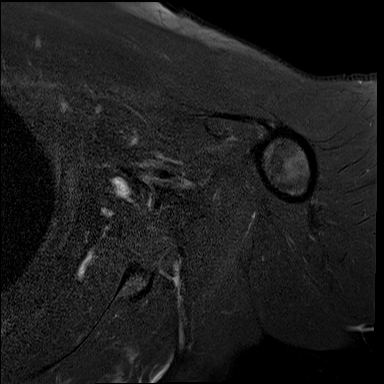
[im 9/25]
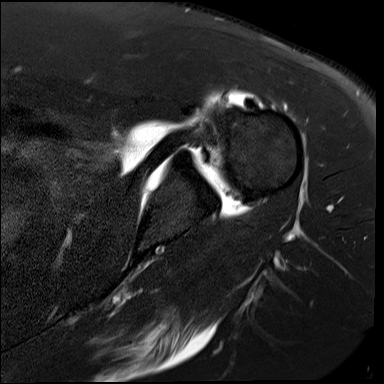
[im 11/25]
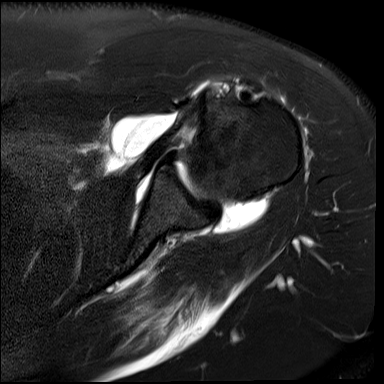
[im 14/25]
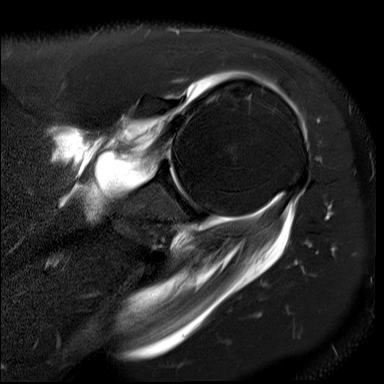
[im 17/25]
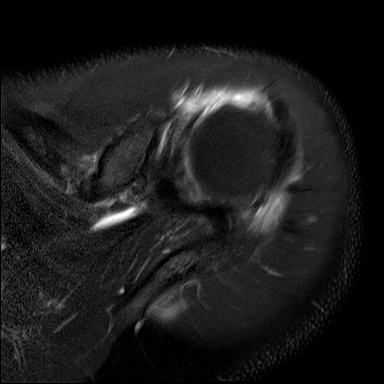
[im 22/25]
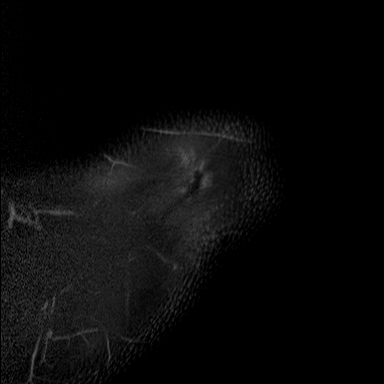
[im 25/25]
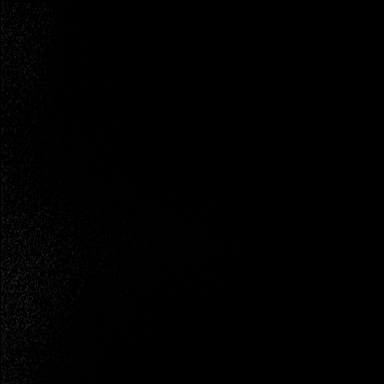

[Series 6: T2 fat-sat · oblique · left · 4.0mm · 0.44mm/px · 7 of 20 slices shown (2 of 3)]
[im 1/20]
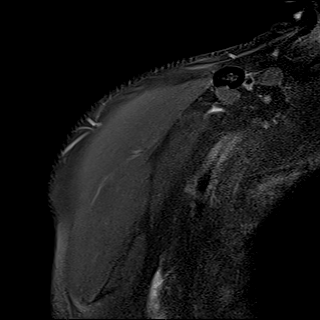
[im 4/20]
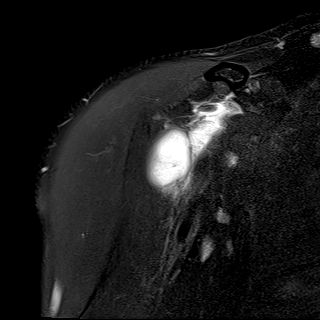
[im 7/20]
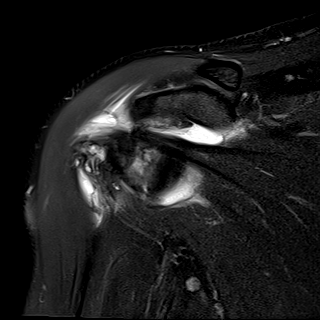
[im 10/20]
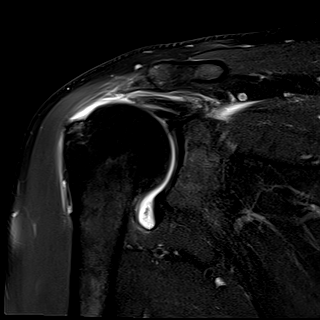
[im 13/20]
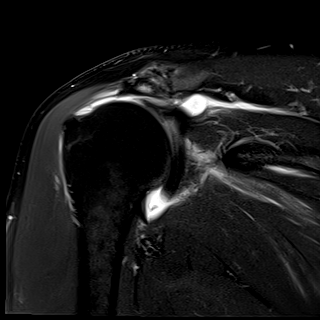
[im 16/20]
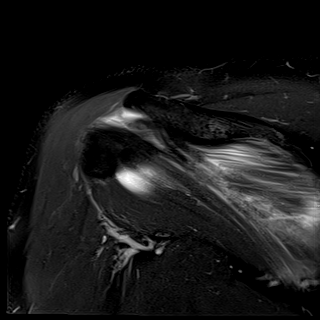
[im 20/20]
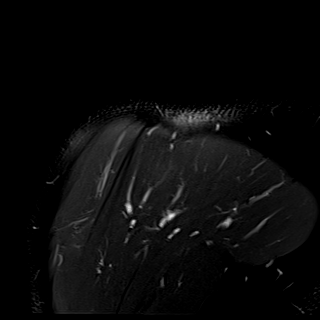

[Series 7: PD fat-sat · oblique · left · 4.0mm · 0.39mm/px · 7 of 20 slices shown]
[im 1/20]
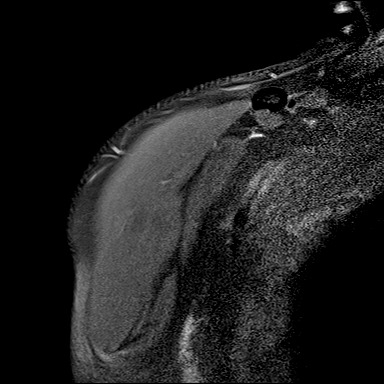
[im 4/20]
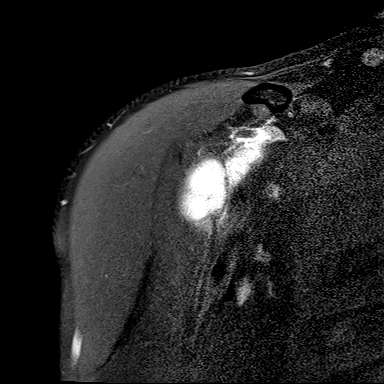
[im 7/20]
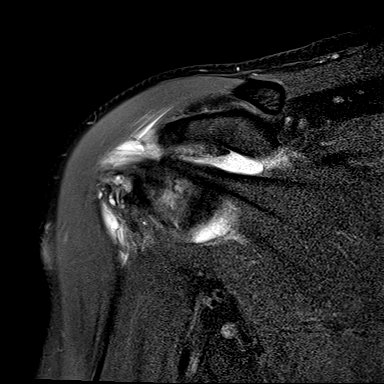
[im 10/20]
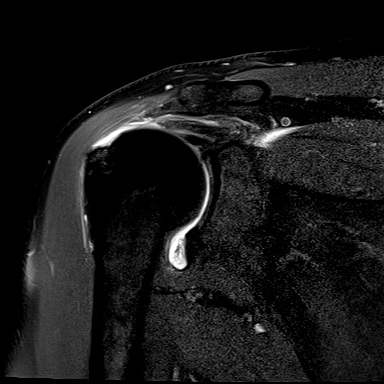
[im 13/20]
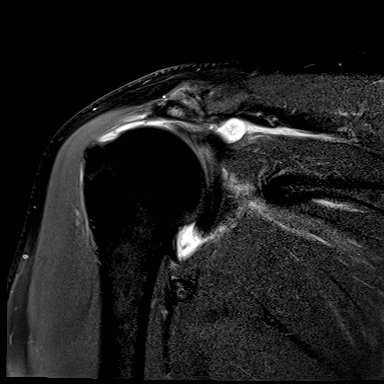
[im 16/20]
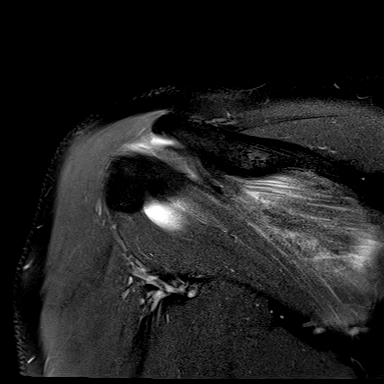
[im 20/20]
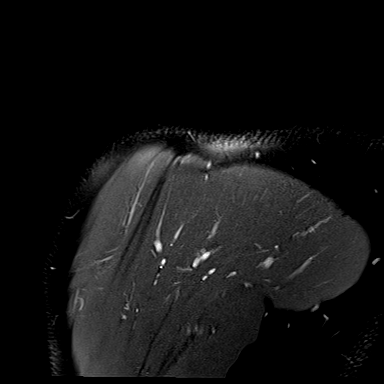

[Series 8: T2 fat-sat · oblique · left · 4.0mm · 0.44mm/px · 8 of 23 slices shown (3 of 3)]
[im 1/23]
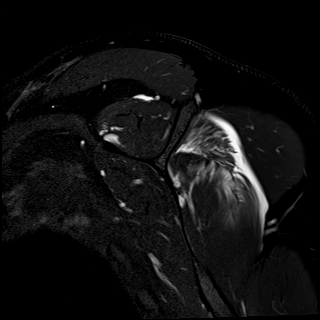
[im 4/23]
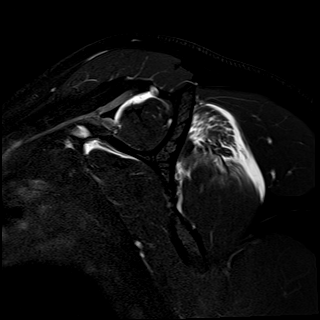
[im 7/23]
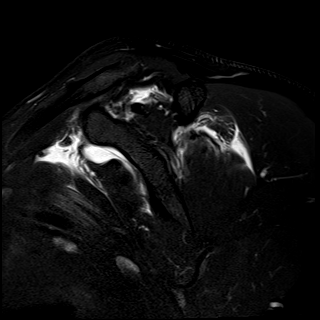
[im 10/23]
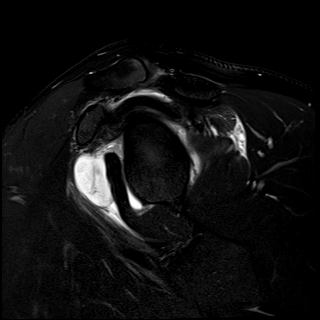
[im 13/23]
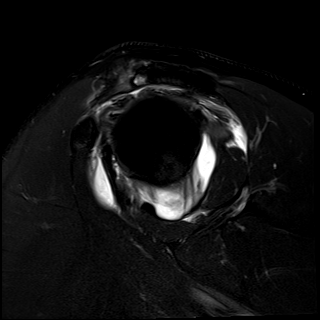
[im 16/23]
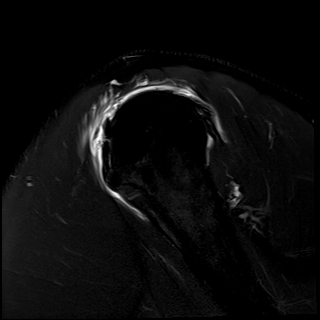
[im 19/23]
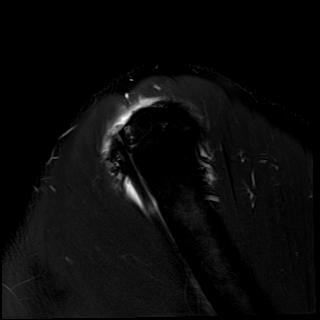
[im 23/23]
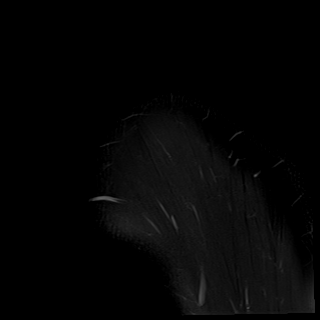

[Series 9: T1 · oblique · left · 4.0mm · 0.36mm/px · 8 of 23 slices shown]
[im 1/23]
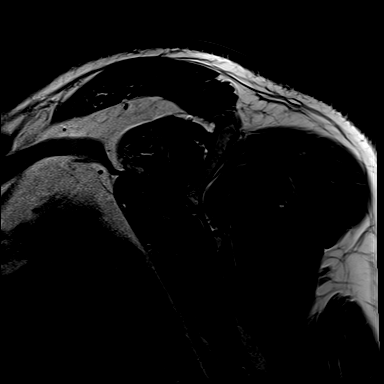
[im 4/23]
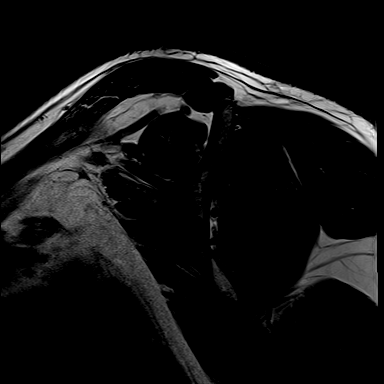
[im 7/23]
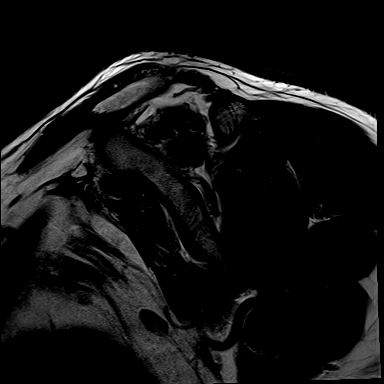
[im 10/23]
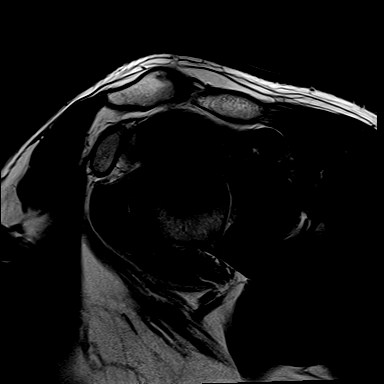
[im 13/23]
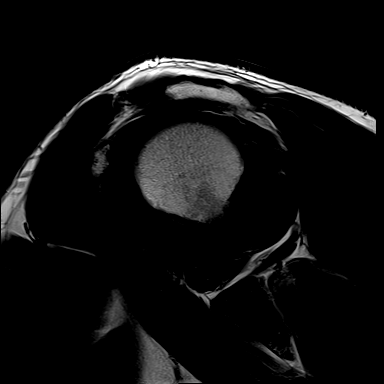
[im 16/23]
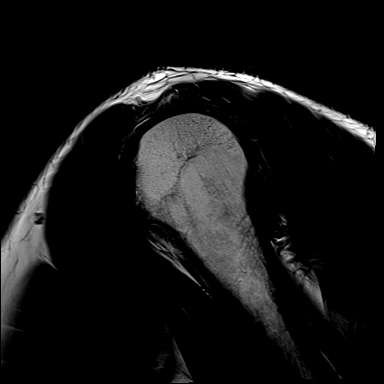
[im 19/23]
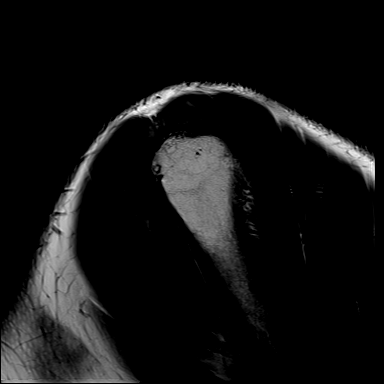
[im 23/23]
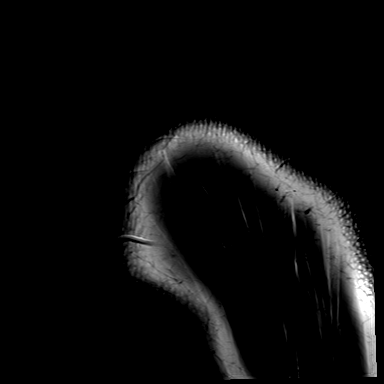

[38 of 40 positions shown; findings below may reference images not displayed]

FINDINGS: Rotator cuff: Complete tear of the supraspinatus tendon with 4 cm of
retraction. Mild tendinosis of the infraspinatus tendon. Teres minor
tendon is intact. Subscapularis tendon is intact.

Muscles: No muscle atrophy or edema. No intramuscular fluid
collection or hematoma.

Biceps Long Head: Tendinosis of the intra-articular portion of the
long head of the biceps tendon with a partial-thickness tear.

Acromioclavicular Joint: Moderate arthropathy of the
acromioclavicular joint. Trace subacromial/subdeltoid bursal fluid.

Glenohumeral Joint: Moderate joint effusion.  No chondral defect.

Labrum: Grossly intact, but evaluation is limited by lack of
intraarticular fluid/contrast.

Bones: No fracture or dislocation. No aggressive osseous lesion.
Subcortical reactive marrow edema in the greater tuberosity.
Subcortical reactive marrow edema in the lesser tuberosity.

Other: No fluid collection or hematoma.
IMPRESSION: 1. Complete tear of the supraspinatus tendon with 4 cm of
retraction.
2. Mild tendinosis of the infraspinatus tendon.
3. Tendinosis of the intra-articular portion of the long head of the
biceps tendon with a partial-thickness tear.

## 2021-09-26 IMAGING — CT CT KNEE*R* W/O CM
3 series · 14 of 33 positions shown, 17 images · non-contrast
Comparison: Right knee radiograph dated [DATE].

CLINICAL DATA: Knee trauma.

EXAM:
CT OF THE LOWER RIGHT EXTREMITY WITHOUT CONTRAST
TECHNIQUE: Multidetector CT imaging of the right lower extremity was performed
according to the standard protocol.
RADIATION DOSE REDUCTION: This exam was performed according to the
departmental dose-optimization program which includes automated
exposure control, adjustment of the mA and/or kV according to
patient size and/or use of iterative reconstruction technique.

[Series 4: lfov ext 3.0 (person_name)40(person_name) (person_ · axial · 0.58mm/px · z∈[-303,-57]mm · 6 of 108 slices shown, 8 images]
[im 17/108  soft-tissue]
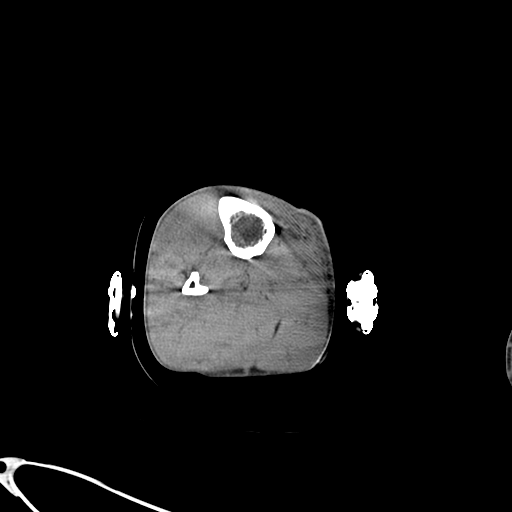
[im 17/108  bone]
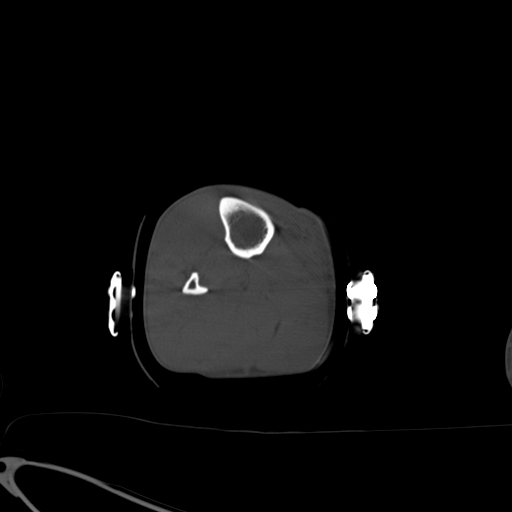
[im 33/108  bone]
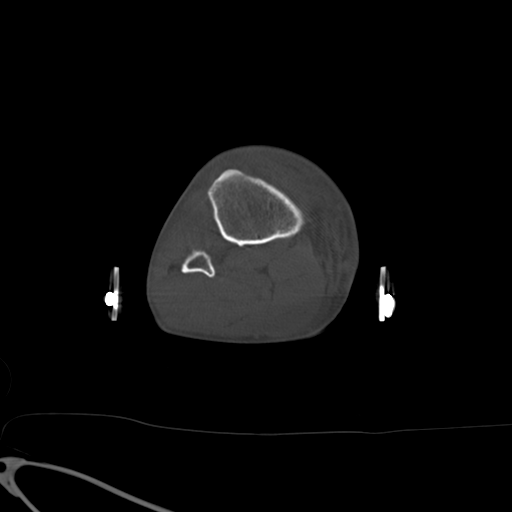
[im 50/108  bone]
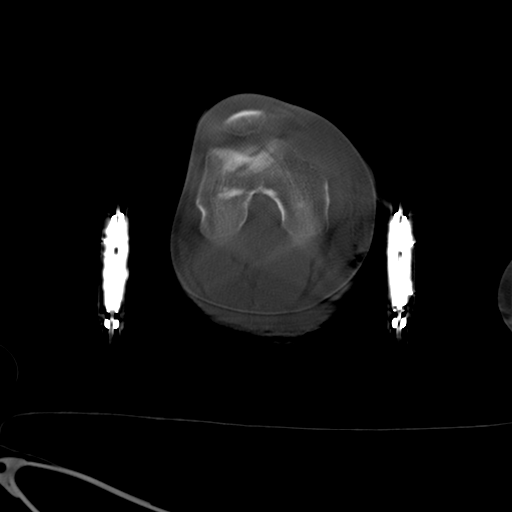
[im 66/108  bone]
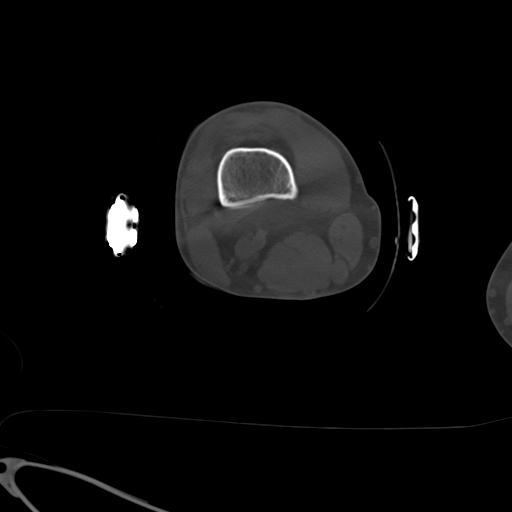
[im 83/108  soft-tissue]
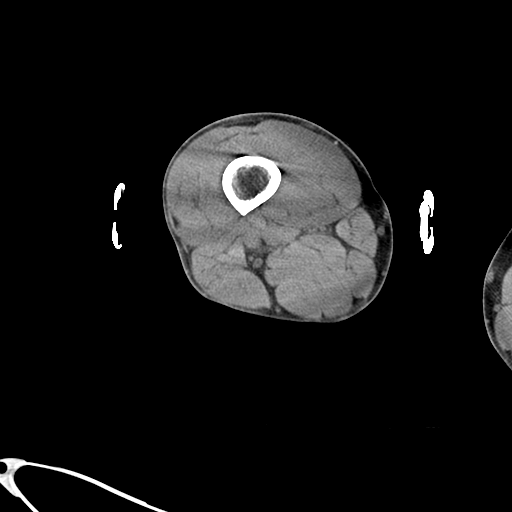
[im 83/108  bone]
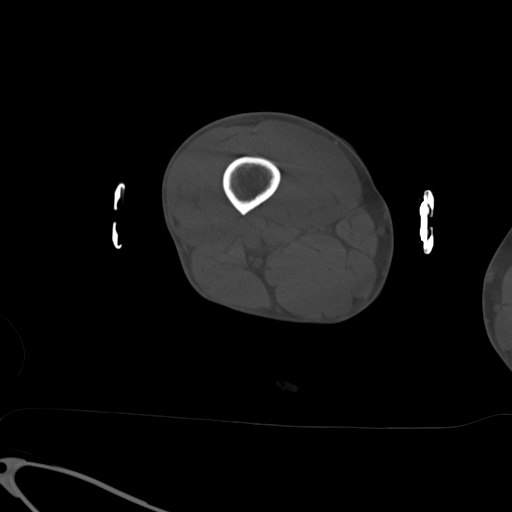
[im 99/108  bone]
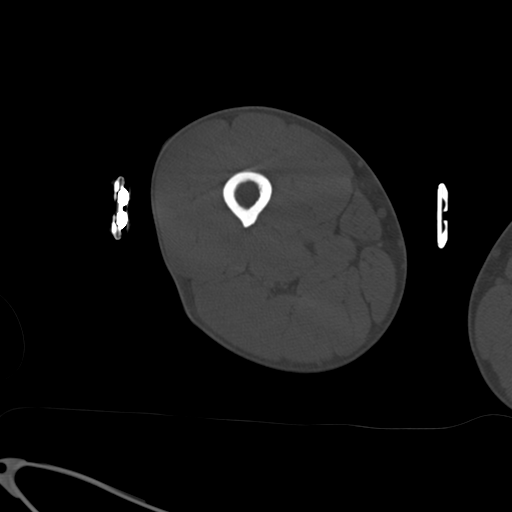

[Series 7: coronalsoft tissue · coronal · 0.52mm/px · 3 of 126 slices shown]
[im 26/126  bone]
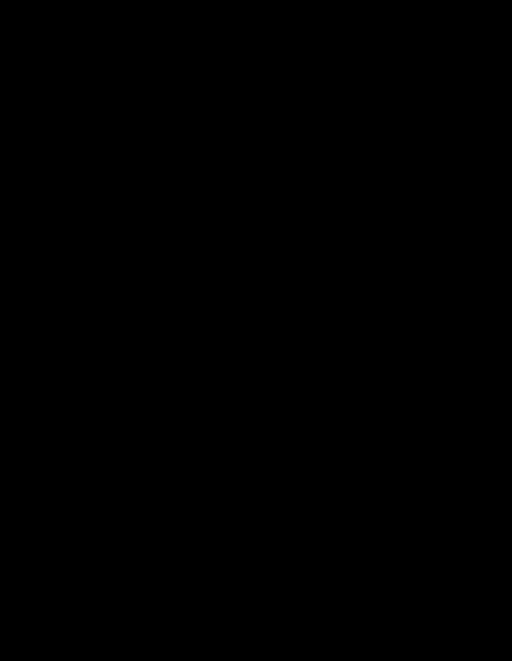
[im 51/126  bone]
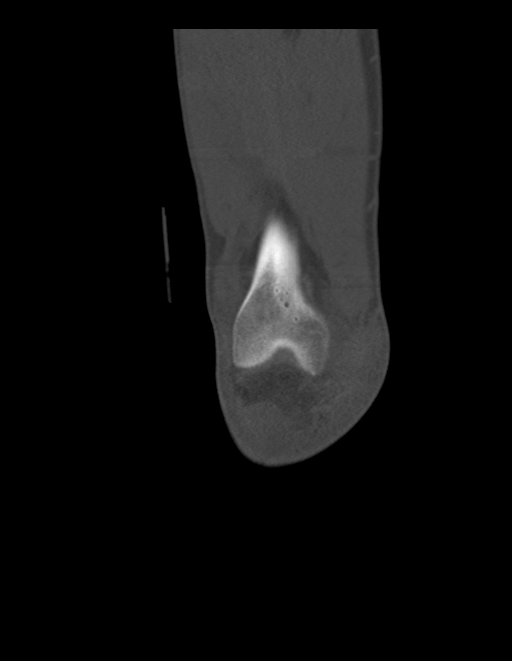
[im 76/126  bone]
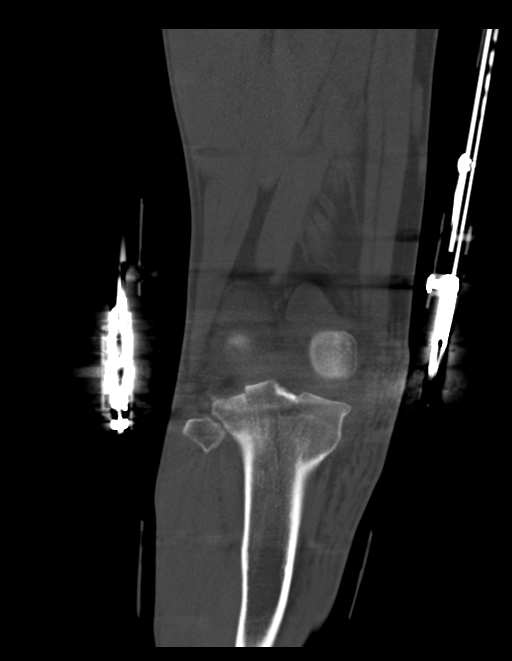

[Series 8: sagittalsoft tissue · sagittal · 0.48mm/px · 5 of 126 slices shown, 6 images]
[im 42/126  bone]
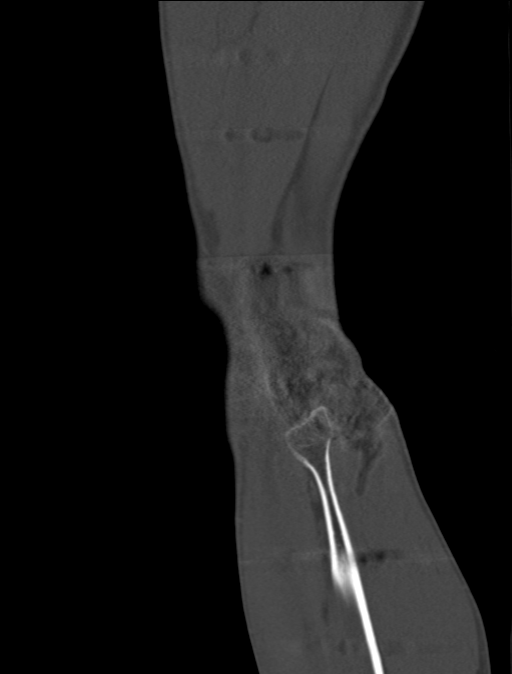
[im 53/126  bone]
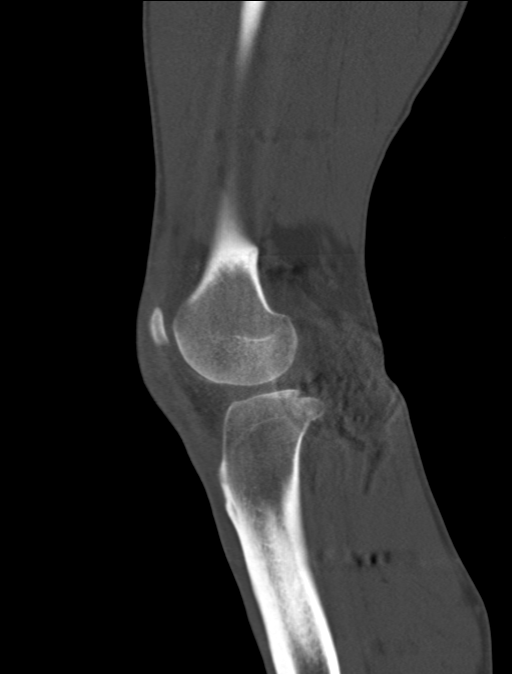
[im 63/126  soft-tissue]
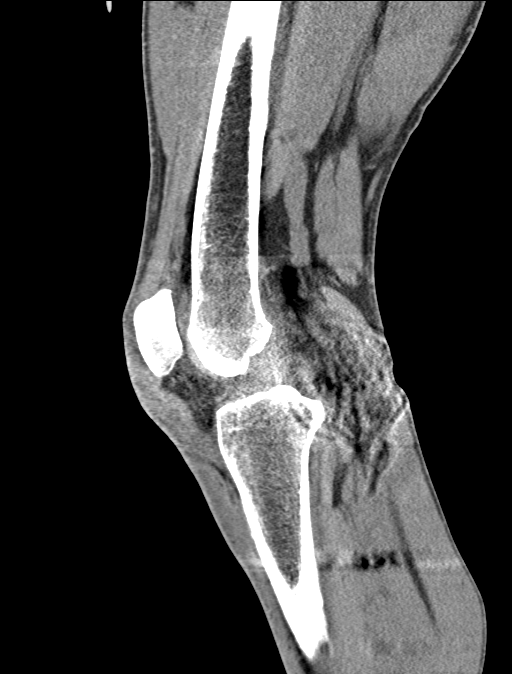
[im 63/126  bone]
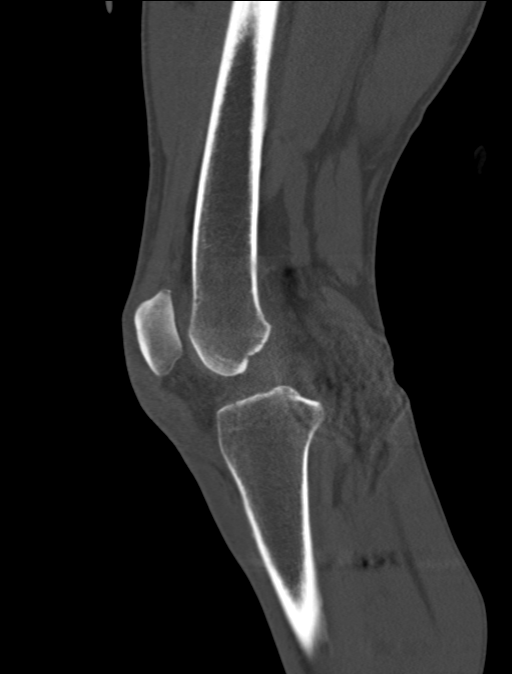
[im 73/126  bone]
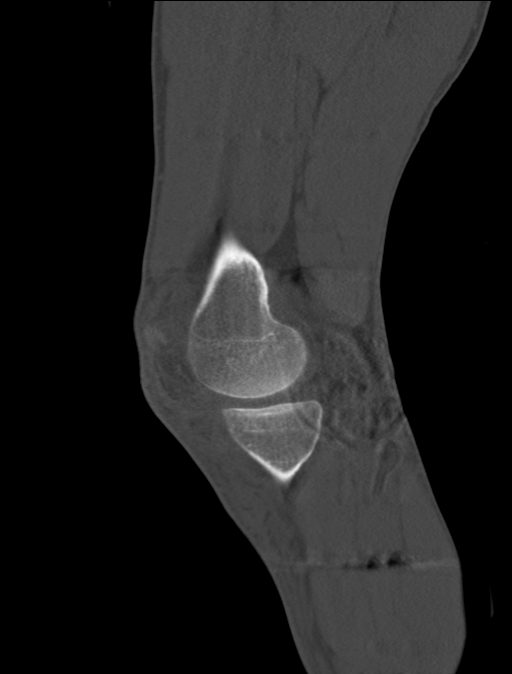
[im 84/126  bone]
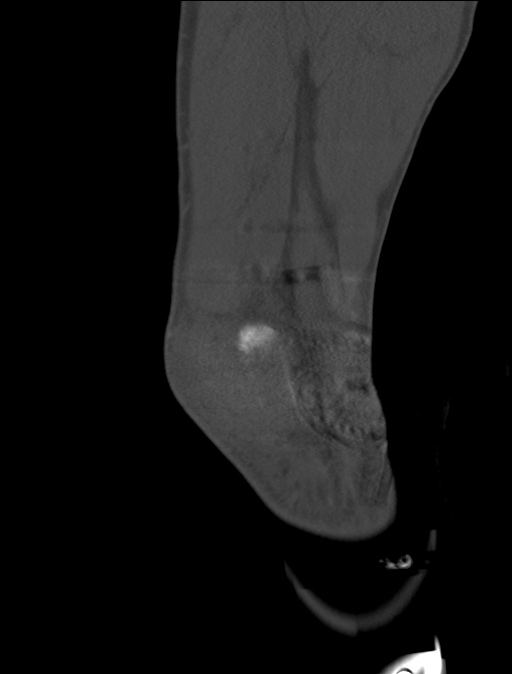

[14 of 33 positions shown; findings below may reference images not displayed]

FINDINGS: Bones/Joint/Cartilage

There is a depressed fracture of the posterior lateral tibial
plateau. No other acute fracture. No dislocation. The bones are well
mineralized. Small suprapatellar effusion.

Ligaments

Suboptimally assessed by CT.

Muscles and Tendons

No acute findings.

Soft tissues

Diffuse subcutaneous edema and swelling of the knee. No fluid
collection.
IMPRESSION: Depressed fracture of the posterior lateral tibial plateau.

## 2021-09-26 IMAGING — MR MR KNEE*R* W/O CM
4 of 7 series · 11 of 40 positions shown · non-contrast
Comparison: Radiographs [DATE]

CLINICAL DATA: Fell off a roof.  Injured knee.

EXAM:
MRI OF THE RIGHT KNEE WITHOUT CONTRAST
TECHNIQUE: Multiplanar, multisequence MR imaging of the knee was performed. No
intravenous contrast was administered.

[Series 3: T2 fat-sat · axial · 4.0mm · 0.31mm/px · z∈[-70,-25]mm · 2 of 27 slices shown]
[im 5/27]
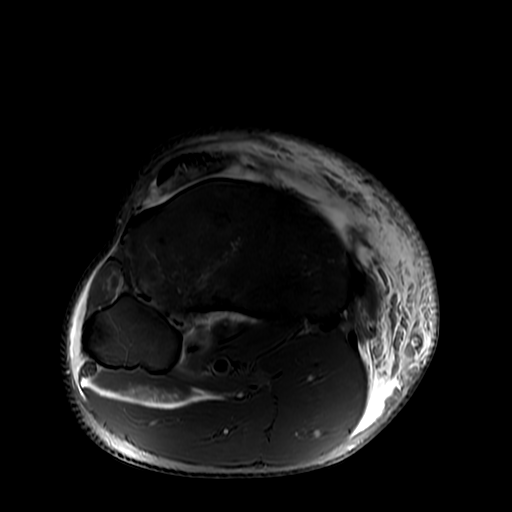
[im 14/27]
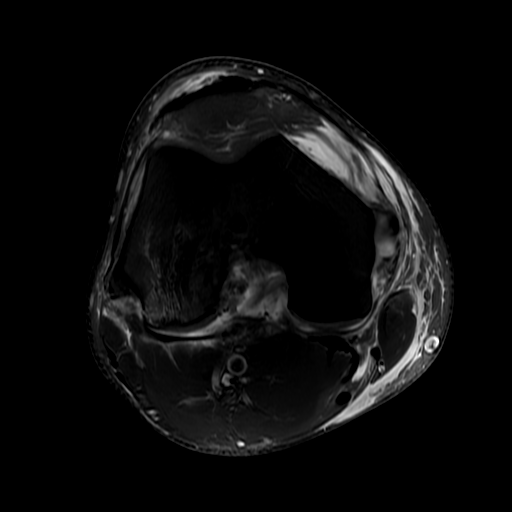

[Series 5: PD fat-sat · sagittal · 3.0mm · 0.29mm/px · 3 of 29 slices shown (1 of 3)]
[im 5/29]
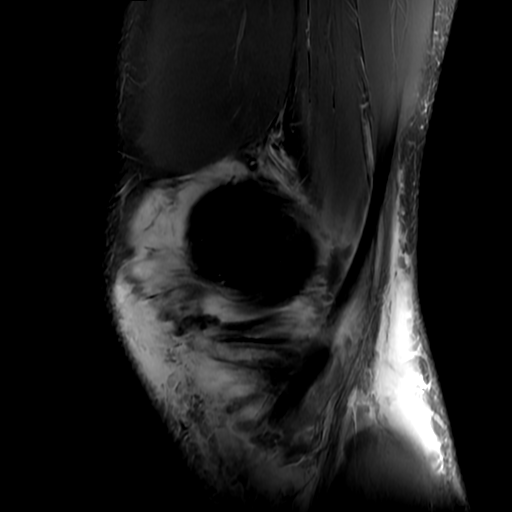
[im 15/29]
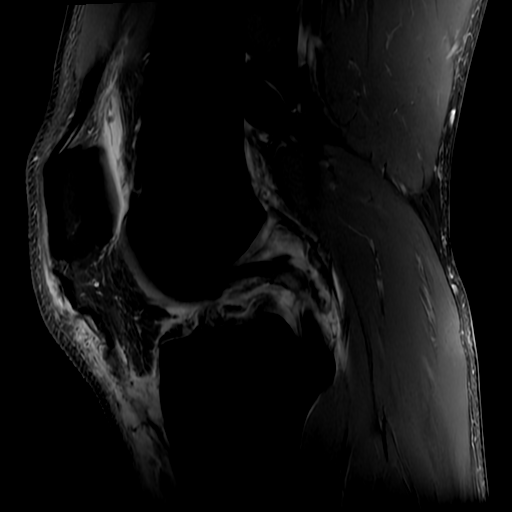
[im 24/29]
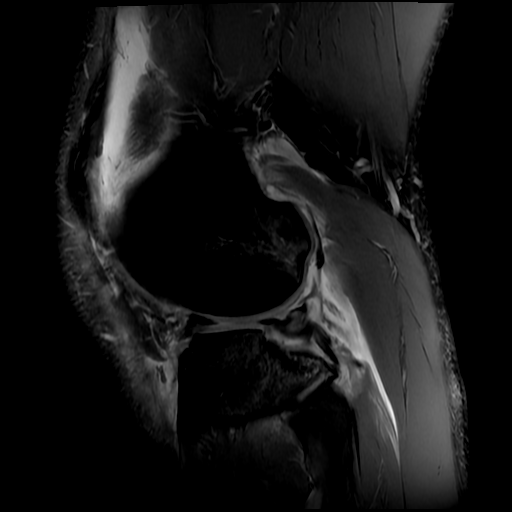

[Series 6: PD fat-sat · coronal · 4.0mm · 0.16mm/px · 3 of 21 slices shown (2 of 3)]
[im 1/21]
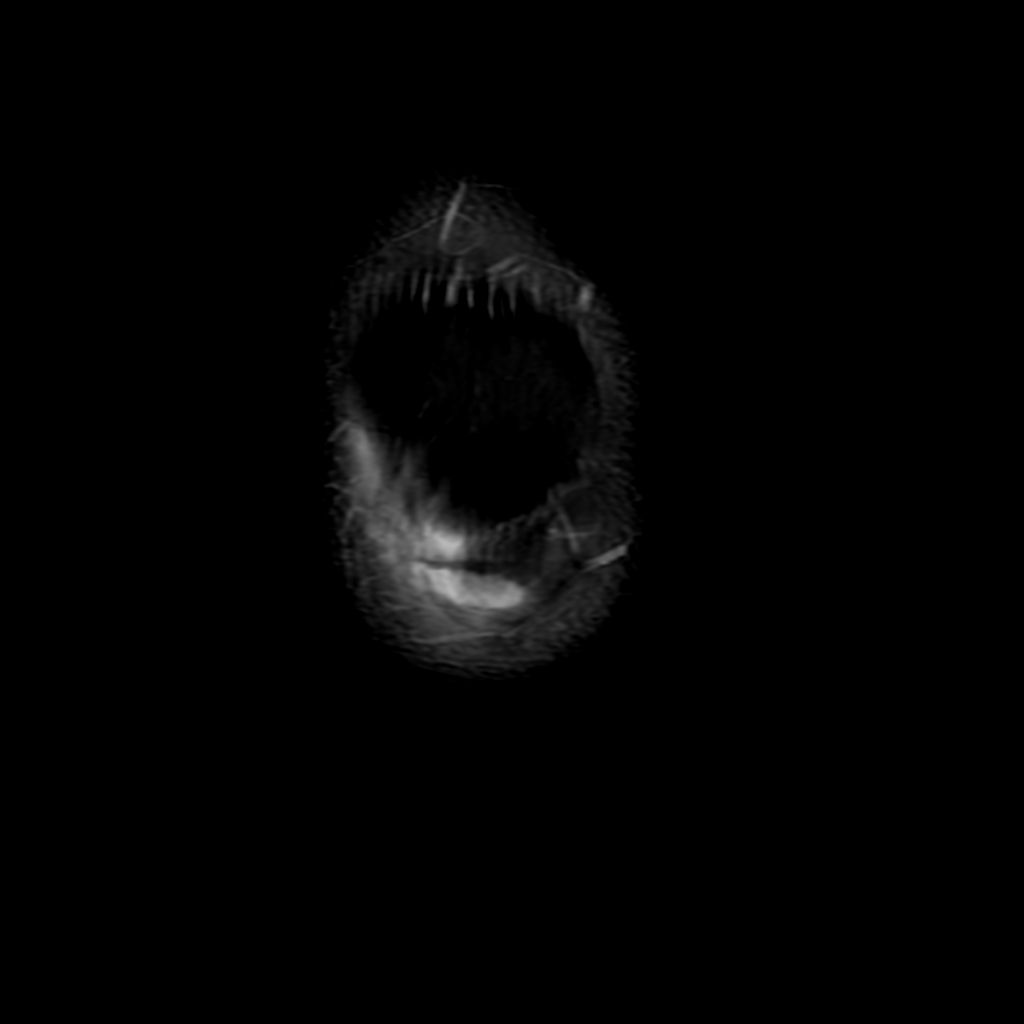
[im 11/21]
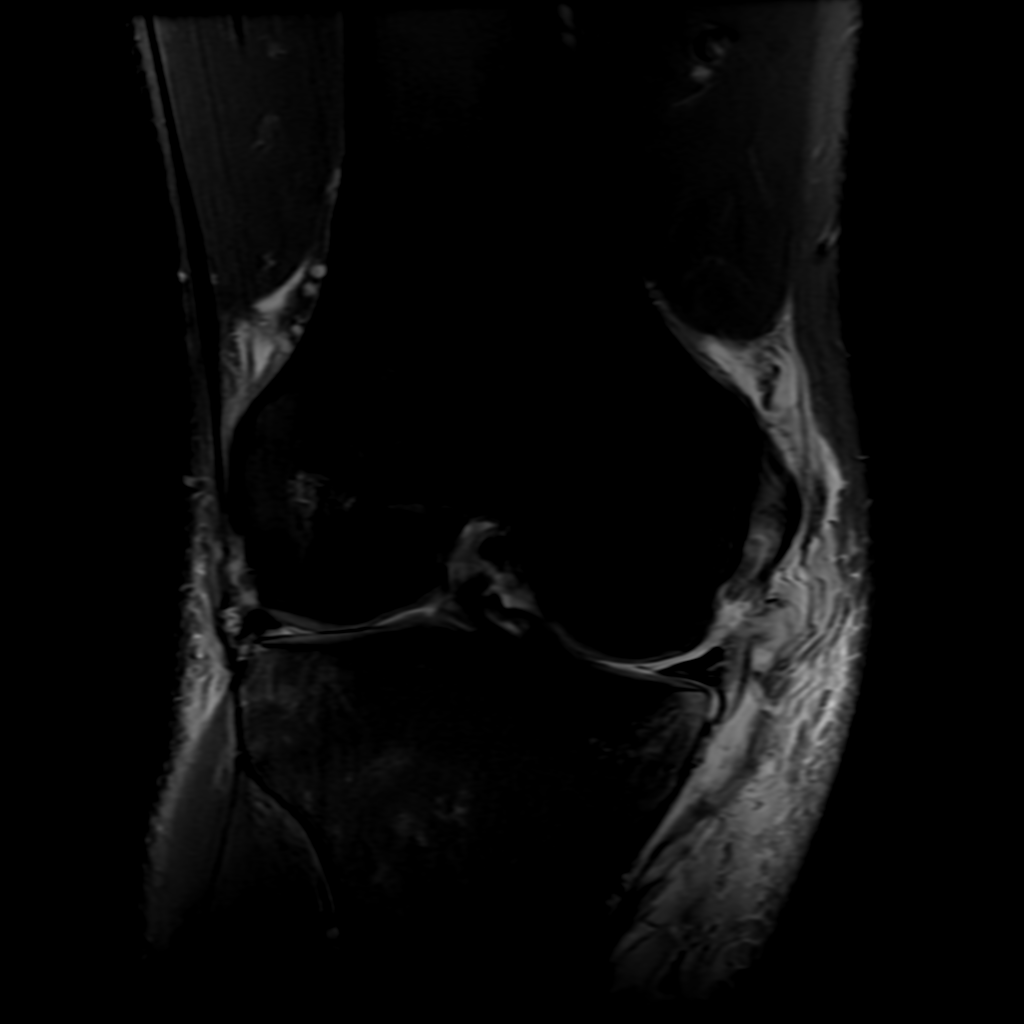
[im 21/21]
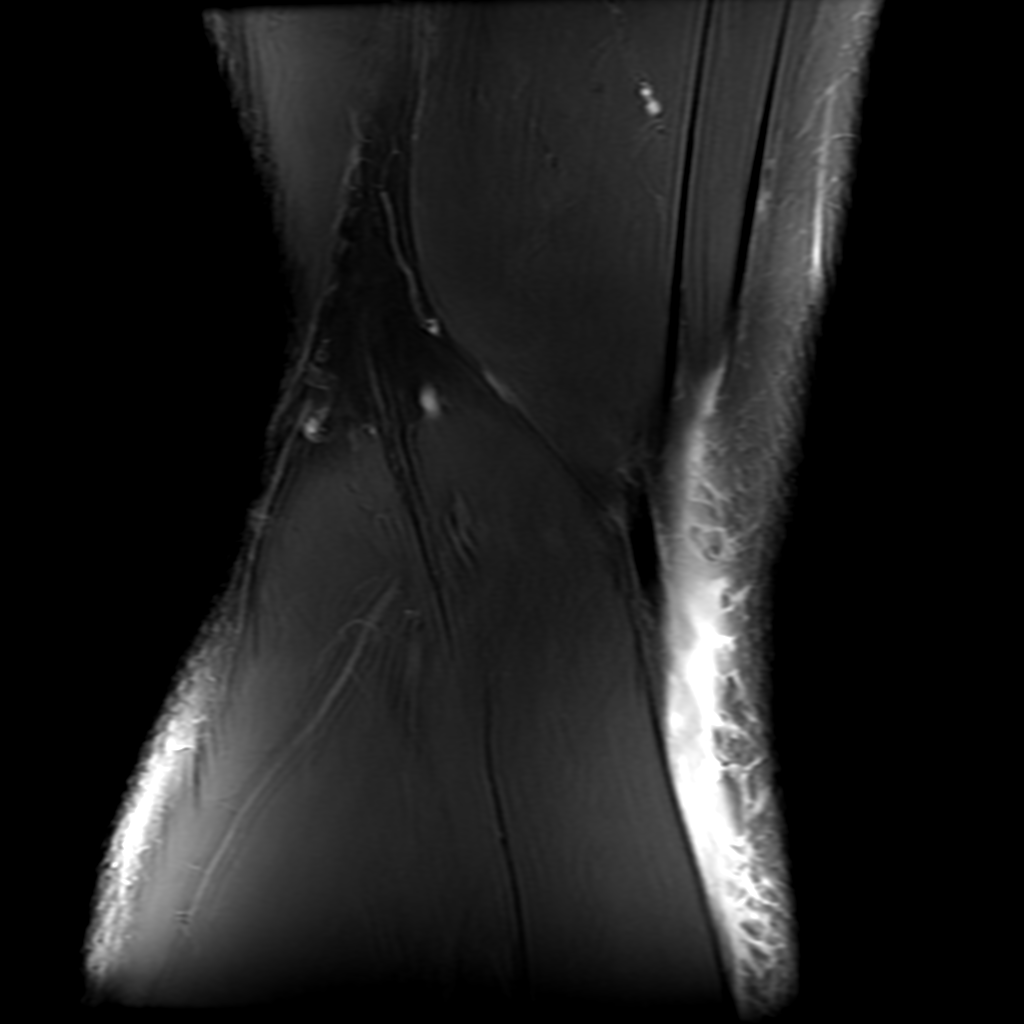

[Series 9: PD fat-sat · coronal · 2.0mm · 0.35mm/px · 3 of 15 slices shown (3 of 3)]
[im 1/15]
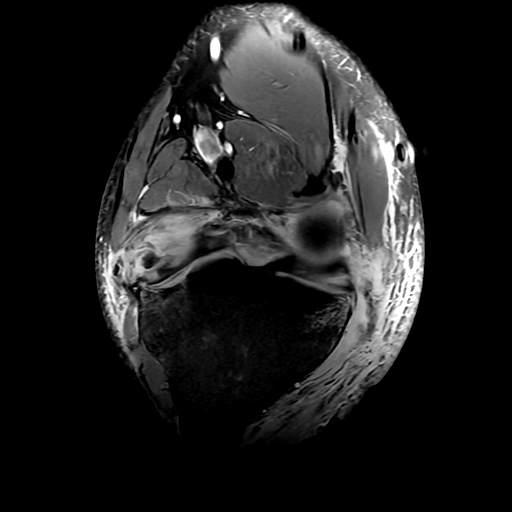
[im 10/15]
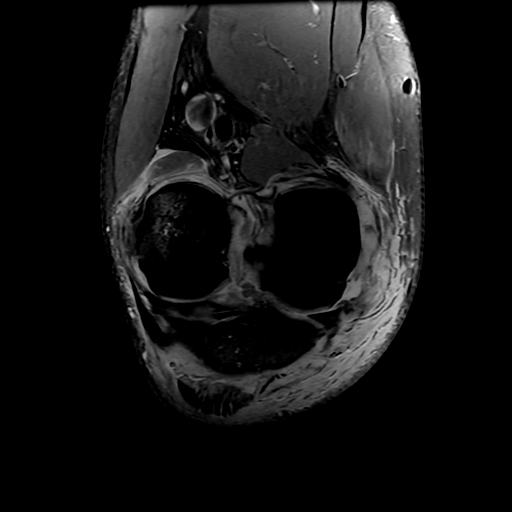
[im 15/15]
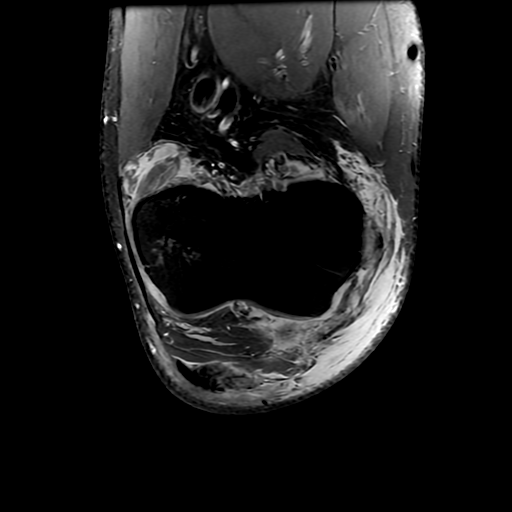

[11 of 40 positions shown; findings below may reference images not displayed]

FINDINGS: MENISCI

Medial meniscus: Large oblique coursing inferior articular surface
tear involving the posterior horn mid body junction region

Lateral meniscus: The a extensively torn posterior horn and mid body
region.

LIGAMENTS

Cruciates:  The ACL and PCL are both torn midsubstance.

Collaterals: The MCL complex is completely ruptured at the joint
level. The LCL complex is intact.

CARTILAGE

Patellofemoral:  Normal

Medial:  Intact

Lateral: Depressed lateral tibial plateau fracture and associated
cartilage disruption. Maximum depression is 4.5 mm.

Joint:  Large joint effusion and Lipohemarthrosis.

Popliteal Fossa:  No popliteal mass or Baker's cyst.

Extensor Mechanism: The patella retinacular structures are intact.
There is partial-thickness tearing of the patellar tendon mainly
along the medial component. The quadriceps tendon is intact.

Bones: Depressed focal die punch type lateral tibial plateau
fracture posteriorly. Maximum depression is approximately 4.5 mm.
There is also a focal impaction type injury and small bone contusion
involving the posterior aspect of the lateral femoral condyle.

Other: The popliteus tendon is intact. The arcuate ligament is torn.
IMPRESSION: 1. Significant complex knee injury including depressed lateral
tibial plateau fracture, ACL, PCL and MCL ruptures, medial and
lateral meniscus tears, arcuate ligament rupture and partial tearing
of the patellar tendon.
2. Large joint effusion/lipohemarthrosis.

## 2021-09-26 IMAGING — DX DG CHEST 1V PORT
1 series · 1 of 1 positions shown · non-contrast
Comparison: CT and portable chest both performed yesterday.

CLINICAL DATA: Rib fractures.

EXAM:
PORTABLE CHEST 1 VIEW

[chest]
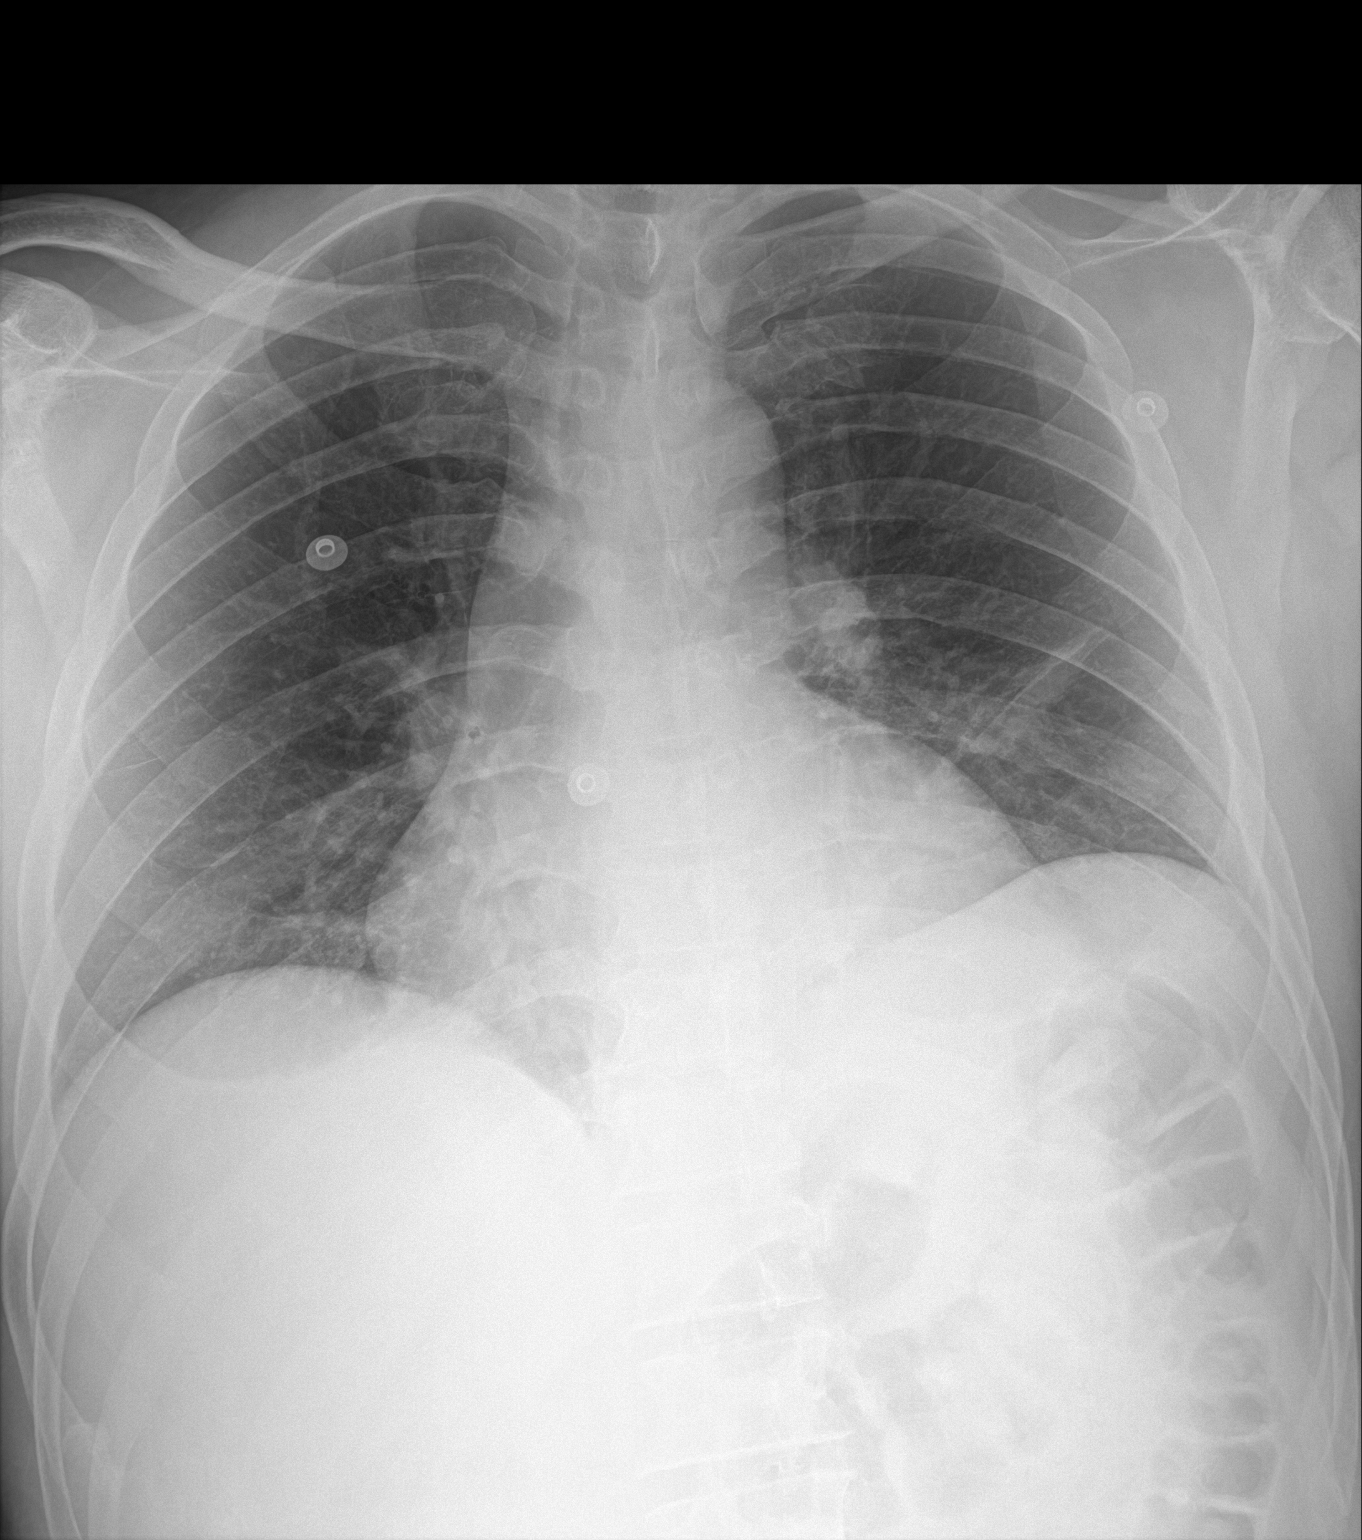

[1 of 1 positions shown; findings below may reference images not displayed]

FINDINGS: Mild cardiomegaly. Central vessels are normal caliber. Stable
mediastinal configuration. The lungs hypoinflated with increased
linear atelectasis in the bases but no convincing pneumonia or other
focal abnormality.

Multilevel left ribcage fractures described previously are better
demonstrated with CT. No measurable pneumothorax is seen. No pleural
collections are evident.
IMPRESSION: Increased linear atelectasis in the lower lung fields. No further or
acute new abnormality. The CT demonstrated a trace left
anterior/medial pneumothorax but it is not visible radiographically.
Left ribcage fractures were better demonstrated on CT. Mild
cardiomegaly.

## 2021-09-26 NOTE — Evaluation (Signed)
Physical Therapy Evaluation Patient Details Name: Vincent Mccarthy MRN: [id number] DOB: [birth year] Today's Date: 09/26/2021  History of Present Illness  Pt is a 51 y/o male admitted following fall off of roof. Found to have L 7-9 rib fractures with small pneumothorax. Pt also with R knee and L shoulder pain, however, X-ray negative.  Clinical Impression  Pt admitted secondary to problem above with deficits below. Pt requiring min guard A for mobility tasks using RW. Pt reporting increased pain in R knee. R knee with increased swelling and slightly valgus posture. Very limited weightbearing tolerance on RLE and when pt was able to put some weight, pt reports it felt unstable; PA notified. Recommending DME below to increase safety. Would benefit from outpatient PT once cleared by MD. Will continue to follow acutely.      Recommendations for follow up therapy are one component of a multi-disciplinary discharge planning process, led by the attending physician.  Recommendations may be updated based on patient status, additional functional criteria and insurance authorization.  Follow Up Recommendations Outpatient PT (once cleared by MD)    Assistance Recommended at Discharge Intermittent Supervision/Assistance  Patient can return home with the following  Help with stairs or ramp for entrance    Equipment Recommendations Rolling walker (2 wheels);BSC/3in1  Recommendations for Other Services       Functional Status Assessment Patient has had a recent decline in their functional status and demonstrates the ability to make significant improvements in function in a reasonable and predictable amount of time.     Precautions / Restrictions Precautions Precautions: Fall Restrictions Weight Bearing Restrictions: No      Mobility  Bed Mobility Overal bed mobility: Modified Independent                  Transfers Overall transfer level: Needs assistance Equipment used: Rolling  walker (2 wheels) Transfers: Sit to/from Stand Sit to Stand: Min guard           General transfer comment: Min guard for safety. Cues for kicking RLE to help with pain management. Little to no weight placed on RLE    Ambulation/Gait Ambulation/Gait assistance: Min guard Gait Distance (Feet): 30 Feet Assistive device: Rolling walker (2 wheels) Gait Pattern/deviations: Step-to pattern, Decreased step length - right, Decreased step length - left, Decreased weight shift to right Gait velocity: Decreased     General Gait Details: Slow, antalgic gait. Pt putting very minimal weight on RLE and when he was able to, reports it felt unstable. PA notified. Min guard for safety. Educated about using RW at home.  Stairs            Wheelchair Mobility    Modified Rankin (Stroke Patients Only)       Balance Overall balance assessment: Needs assistance Sitting-balance support: No upper extremity supported, Feet supported Sitting balance-Leahy Scale: Fair     Standing balance support: Bilateral upper extremity supported Standing balance-Leahy Scale: Poor Standing balance comment: Reliant on BUE support                             Pertinent Vitals/Pain Pain Assessment Pain Assessment: Faces Faces Pain Scale: Hurts even more Pain Location: L shoulder, L ribs, R knee Pain Descriptors / Indicators: Grimacing, Guarding Pain Intervention(s): Limited activity within patient's tolerance, Monitored during session, Repositioned    Home Living Family/patient expects to be discharged to:: Private residence Living Arrangements: Spouse/significant other;Children Available Help at Discharge: Family Type  of Home: House Home Access: Stairs to enter Entrance Stairs-Rails: None Entrance Stairs-Number of Steps: 2   Home Layout: One level Home Equipment: None      Prior Function Prior Level of Function : Independent/Modified Independent;Working/employed                      Hand Dominance   Dominant Hand: Right    Extremity/Trunk Assessment   Upper Extremity Assessment Upper Extremity Assessment: Defer to OT evaluation    Lower Extremity Assessment Lower Extremity Assessment: RLE deficits/detail RLE Deficits / Details: R knee swollen and in slightly valgus position. Pt reports instability with weightbearing.    Cervical / Trunk Assessment Cervical / Trunk Assessment: Normal  Communication      Cognition Arousal/Alertness: Awake/alert Behavior During Therapy: WFL for tasks assessed/performed Overall Cognitive Status: Within Functional Limits for tasks assessed                                          General Comments      Exercises     Assessment/Plan    PT Assessment Patient needs continued PT services  PT Problem List Decreased range of motion;Decreased activity tolerance;Decreased balance;Decreased mobility;Decreased knowledge of precautions;Pain       PT Treatment Interventions DME instruction;Gait training;Stair training;Functional mobility training;Therapeutic activities;Therapeutic exercise;Balance training;Patient/family education    PT Goals (Current goals can be found in the Care Plan section)  Acute Rehab PT Goals Patient Stated Goal: to decrease pain PT Goal Formulation: With patient Time For Goal Achievement: 10/10/21 Potential to Achieve Goals: Good    Frequency Min 3X/week     Co-evaluation PT/OT/SLP Co-Evaluation/Treatment: Yes Reason for Co-Treatment: For patient/therapist safety;To address functional/ADL transfers PT goals addressed during session: Mobility/safety with mobility;Balance;Proper use of DME         AM-PAC PT "6 Clicks" Mobility  Outcome Measure Help needed turning from your back to your side while in a flat bed without using bedrails?: None Help needed moving from lying on your back to sitting on the side of a flat bed without using bedrails?: None Help needed moving to  and from a bed to a chair (including a wheelchair)?: A Little Help needed standing up from a chair using your arms (e.g., wheelchair or bedside chair)?: A Little Help needed to walk in hospital room?: A Little Help needed climbing 3-5 steps with a railing? : A Little 6 Click Score: 20    End of Session Equipment Utilized During Treatment: Gait belt Activity Tolerance: Patient tolerated treatment well Patient left: in bed;with call bell/phone within reach;with family/visitor present Nurse Communication: Mobility status PT Visit Diagnosis: Difficulty in walking, not elsewhere classified (R26.2);Pain Pain - Right/Left: Left Pain - part of body: Shoulder (R knee)    Time: 1025-8527 PT Time Calculation (min) (ACUTE ONLY): 15 min   Charges:   PT Evaluation $PT Eval Moderate Complexity: 1 Mod          Farley Ly, PT, DPT  Acute Rehabilitation Services  Pager: [phone] Office: [phone]   Vincent Mccarthy 09/26/2021, 9:46 AM

## 2021-09-26 NOTE — Consult Note (Signed)
Reason for Consult:Right knee pain Referring Physician: Violeta Gelinas Time called: 2595 Time at bedside: 0950   Vincent Mccarthy is an 51 y.o. male.  HPI: Vincent Mccarthy fell from a roof about 15-20 feet. He hit a canopy on the way down with his chest. He had significant pain with that that he doesn't really remember the rest of the fall. He was brought to the ED where x-rays showed rib fxs and he was admitted. He also had significant right knee pain but workup was negative for fx and orthopedic surgery was asked to consult for further recommendations. He works in [employer].  History reviewed. No pertinent past medical history.  History reviewed. No pertinent surgical history.  History reviewed. No pertinent family history.  Social History:  reports that he has never smoked. He has never used smokeless tobacco. He reports current alcohol use. He reports that he does not use drugs.  Allergies: No Known Allergies  Medications: I have reviewed the patient's current medications.  Results for orders placed or performed during the hospital encounter of 09/25/21 (from the past 48 hour(s))  CBC     Status: Abnormal   Collection Time: 09/25/21  2:32 PM  Result Value Ref Range   WBC 14.7 (H) 4.0 - 10.5 K/uL   RBC 4.98 4.22 - 5.81 MIL/uL   Hemoglobin 14.7 13.0 - 17.0 g/dL   HCT 63.8 75.6 - 43.3 %   MCV 88.8 80.0 - 100.0 fL   MCH 29.5 26.0 - 34.0 pg   MCHC 33.3 30.0 - 36.0 g/dL   RDW 29.5 18.8 - 41.6 %   Platelets 269 150 - 400 K/uL   nRBC 0.0 0.0 - 0.2 %    Comment: Performed at [facility], [address], [place], [place] [address]  I-stat chem 8, ED (not at [facility] or [facility])     Status: Abnormal   Collection Time: 09/25/21  2:40 PM  Result Value Ref Range   Sodium 138 135 - 145 mmol/L   Potassium 3.2 (L) 3.5 - 5.1 mmol/L   Chloride 102 98 - 111 mmol/L   BUN 17 6 - 20 mg/dL   Creatinine, Ser 1.60 0.61 - 1.24 mg/dL   Glucose, Bld 109 (H) 70 - 99 mg/dL    Comment: Glucose reference  range applies only to samples taken after fasting for at least 8 hours.   Calcium, Ion 1.02 (L) 1.15 - 1.40 mmol/L   TCO2 25 22 - 32 mmol/L   Hemoglobin 15.0 13.0 - 17.0 g/dL   HCT 32.3 55.7 - 32.2 %  HIV Antibody (routine testing w rflx)     Status: None   Collection Time: 09/25/21  6:29 PM  Result Value Ref Range   HIV Screen 4th Generation wRfx Non Reactive Non Reactive    Comment: Performed at [facility], [address], [place], [place] [address]  Resp Panel by RT-PCR (Flu A&B, Covid) Nasopharyngeal Swab     Status: None   Collection Time: 09/25/21  9:16 PM   Specimen: Nasopharyngeal Swab; Nasopharyngeal(NP) swabs in vial transport medium  Result Value Ref Range   SARS Coronavirus 2 by RT PCR NEGATIVE NEGATIVE    Comment: (NOTE) SARS-CoV-2 target nucleic acids are NOT DETECTED.  The SARS-CoV-2 RNA is generally detectable in upper respiratory specimens during the acute phase of infection. The lowest concentration of SARS-CoV-2 viral copies this assay can detect is 138 copies/mL. A negative result does not preclude SARS-Cov-2 infection and should not be  used as the sole basis for treatment or other patient management decisions. A negative result may occur with  improper specimen collection/handling, submission of specimen other than nasopharyngeal swab, presence of viral mutation(s) within the areas targeted by this assay, and inadequate number of viral copies(<138 copies/mL). A negative result must be combined with clinical observations, patient history, and epidemiological information. The expected result is Negative.  Fact Sheet for Patients:  BloggerCourse.comhttps://www.fda.gov/media/152166/download  Fact Sheet for Healthcare Providers:  SeriousBroker.ithttps://www.fda.gov/media/152162/download  This test is no t yet approved or cleared by the [place] FDA and  has been authorized for detection and/or diagnosis of SARS-CoV-2 by FDA under an Emergency Use Authorization (EUA). This EUA will  remain  in effect (meaning this test can be used) for the duration of the COVID-19 declaration under Section 564(b)(1) of the Act, 21 U.S.C.section 360bbb-3(b)(1), unless the authorization is terminated  or revoked sooner.       Influenza A by PCR NEGATIVE NEGATIVE   Influenza B by PCR NEGATIVE NEGATIVE    Comment: (NOTE) The Xpert Xpress SARS-CoV-2/FLU/RSV plus assay is intended as an aid in the diagnosis of influenza from Nasopharyngeal swab specimens and should not be used as a sole basis for treatment. Nasal washings and aspirates are unacceptable for Xpert Xpress SARS-CoV-2/FLU/RSV testing.  Fact Sheet for Patients: BloggerCourse.comhttps://www.fda.gov/media/152166/download  Fact Sheet for Healthcare Providers: SeriousBroker.ithttps://www.fda.gov/media/152162/download  This test is not yet approved or cleared by the [place] FDA and has been authorized for detection and/or diagnosis of SARS-CoV-2 by FDA under an Emergency Use Authorization (EUA). This EUA will remain in effect (meaning this test can be used) for the duration of the COVID-19 declaration under Section 564(b)(1) of the Act, 21 U.S.C. section 360bbb-3(b)(1), unless the authorization is terminated or revoked.  Performed at [facility], [address] [place], [place] [address]   CBC     Status: Abnormal   Collection Time: 09/26/21  5:48 AM  Result Value Ref Range   WBC 11.8 (H) 4.0 - 10.5 K/uL   RBC 4.77 4.22 - 5.81 MIL/uL   Hemoglobin 14.4 13.0 - 17.0 g/dL   HCT 38.741.3 56.439.0 - 33.252.0 %   MCV 86.6 80.0 - 100.0 fL   MCH 30.2 26.0 - 34.0 pg   MCHC 34.9 30.0 - 36.0 g/dL   RDW 95.112.9 88.411.5 - 16.615.5 %   Platelets 232 150 - 400 K/uL   nRBC 0.0 0.0 - 0.2 %    Comment: Performed at [facility], [address] [place], [place] [address]  Basic metabolic panel     Status: Abnormal   Collection Time: 09/26/21  5:48 AM  Result Value Ref Range   Sodium 135 135 - 145 mmol/L   Potassium 3.6 3.5 - 5.1 mmol/L   Chloride 101 98 - 111  mmol/L   CO2 24 22 - 32 mmol/L   Glucose, Bld 102 (H) 70 - 99 mg/dL    Comment: Glucose reference range applies only to samples taken after fasting for at least 8 hours.   BUN 11 6 - 20 mg/dL   Creatinine, Ser 6.010.82 0.61 - 1.24 mg/dL   Calcium 8.7 (L) 8.9 - 10.3 mg/dL   GFR, Estimated >09>60 >32>60 mL/min    Comment: (NOTE) Calculated using the CKD-EPI Creatinine Equation (2021)    Anion gap 10 5 - 15    Comment: Performed at [facility], [address] [place], [place] [address]    DG Elbow 2 Views Left  Result Date: 09/25/2021 CLINICAL DATA:  Left elbow injury EXAM: LEFT ELBOW - 2 VIEW COMPARISON:  None. FINDINGS: There is no evidence of fracture, dislocation, or joint effusion. There is no evidence of arthropathy or other focal bone abnormality. Soft tissues are unremarkable. IMPRESSION: No acute osseous abnormality identified. Electronically Signed   By: Jannifer Hick M.D.   On: 09/25/2021 15:05   CT Head Wo Contrast  Result Date: 09/25/2021 CLINICAL DATA:  Status post fall from 20 feet. EXAM: CT HEAD WITHOUT CONTRAST CT CERVICAL SPINE WITHOUT CONTRAST TECHNIQUE: Multidetector CT imaging of the head and cervical spine was performed following the standard protocol without intravenous contrast. Multiplanar CT image reconstructions of the cervical spine were also generated. RADIATION DOSE REDUCTION: This exam was performed according to the departmental dose-optimization program which includes automated exposure control, adjustment of the mA and/or kV according to patient size and/or use of iterative reconstruction technique. COMPARISON:  None. FINDINGS: CT HEAD FINDINGS Brain: No evidence of acute infarction, hemorrhage, hydrocephalus, extra-axial collection or mass lesion/mass effect. Vascular: No hyperdense vessel or unexpected calcification. Skull: No osseous abnormality. Sinuses/Orbits: Mucosal thickening of bilateral maxillary sinuses and ethmoid sinuses. Visualized mastoid sinuses  are clear. Visualized orbits demonstrate no focal abnormality. Other: None CT CERVICAL SPINE FINDINGS Alignment: Normal. Skull base and vertebrae: No acute fracture. No primary bone lesion or focal pathologic process. Soft tissues and spinal canal: No prevertebral fluid or swelling. No visible canal hematoma. Disc levels: Degenerative disease with disc height loss at C4-5, C5-6 and C6-7 with bilateral uncovertebral degenerative changes and foraminal narrowing. Upper chest: Lung apices are clear. Other: No fluid collection or hematoma. IMPRESSION: 1. No acute intracranial pathology. 2.  No acute osseous injury of the cervical spine. Electronically Signed   By: Elige Ko M.D.   On: 09/25/2021 15:41   CT Cervical Spine Wo Contrast  Result Date: 09/25/2021 CLINICAL DATA:  Status post fall from 20 feet. EXAM: CT HEAD WITHOUT CONTRAST CT CERVICAL SPINE WITHOUT CONTRAST TECHNIQUE: Multidetector CT imaging of the head and cervical spine was performed following the standard protocol without intravenous contrast. Multiplanar CT image reconstructions of the cervical spine were also generated. RADIATION DOSE REDUCTION: This exam was performed according to the departmental dose-optimization program which includes automated exposure control, adjustment of the mA and/or kV according to patient size and/or use of iterative reconstruction technique. COMPARISON:  None. FINDINGS: CT HEAD FINDINGS Brain: No evidence of acute infarction, hemorrhage, hydrocephalus, extra-axial collection or mass lesion/mass effect. Vascular: No hyperdense vessel or unexpected calcification. Skull: No osseous abnormality. Sinuses/Orbits: Mucosal thickening of bilateral maxillary sinuses and ethmoid sinuses. Visualized mastoid sinuses are clear. Visualized orbits demonstrate no focal abnormality. Other: None CT CERVICAL SPINE FINDINGS Alignment: Normal. Skull base and vertebrae: No acute fracture. No primary bone lesion or focal pathologic process.  Soft tissues and spinal canal: No prevertebral fluid or swelling. No visible canal hematoma. Disc levels: Degenerative disease with disc height loss at C4-5, C5-6 and C6-7 with bilateral uncovertebral degenerative changes and foraminal narrowing. Upper chest: Lung apices are clear. Other: No fluid collection or hematoma. IMPRESSION: 1. No acute intracranial pathology. 2.  No acute osseous injury of the cervical spine. Electronically Signed   By: Elige Ko M.D.   On: 09/25/2021 15:41   CT CHEST ABDOMEN PELVIS W CONTRAST  Result Date: 09/25/2021 CLINICAL DATA:  Abdominal trauma, blunt abdominal trauma in a 51 year old male. Fall 20 feet by report. EXAM: CT CHEST, ABDOMEN, AND PELVIS WITH CONTRAST TECHNIQUE: Multidetector CT imaging  of the chest, abdomen and pelvis was performed following the standard protocol during bolus administration of intravenous contrast. RADIATION DOSE REDUCTION: This exam was performed according to the departmental dose-optimization program which includes automated exposure control, adjustment of the mA and/or kV according to patient size and/or use of iterative reconstruction technique. CONTRAST:  100mL OMNIPAQUE IOHEXOL 350 MG/ML SOLN COMPARISON:  None. FINDINGS: CT CHEST FINDINGS Cardiovascular: Heart size is normal without pericardial effusion. Aortic caliber is normal. Contour of the thoracic aorta is smooth. No periaortic stranding. Central pulmonary vasculature unremarkable on venous phase. Mediastinum/Nodes: Thoracic inlet structures are normal. No mediastinal hematoma. No adenopathy in the chest. Lungs/Pleura: Tiny LEFT anterior and medial pneumothorax. No consolidation or evidence of pleural effusion. Airways are patent. Minimal volume loss at the lung bases Musculoskeletal: See below for full musculoskeletal details. No substantial body wall contusion. CT ABDOMEN PELVIS FINDINGS Hepatobiliary: Liver with smooth contours. No signs of hepatic trauma or focal, suspicious hepatic  lesion. Portal vein is patent. No pericholecystic stranding or signs of biliary duct distension. Pancreas: Normal, without mass, inflammation or ductal dilatation. Spleen: Spleen with smooth contours aside from small cleft in the cephalad spleen. No perisplenic stranding or signs of splenic laceration. No perisplenic fluid. Adrenals/Urinary Tract: Adrenal glands are unremarkable. Symmetric renal enhancement. No sign of hydronephrosis. No suspicious renal lesion or perinephric stranding. Urinary bladder is grossly unremarkable. Stomach/Bowel: No acute gastrointestinal process. The appendix is normal Vascular/Lymphatic: Aorta with smooth contours. No stranding adjacent to the aorta. No adenopathy in the abdomen. No adenopathy in the pelvis Reproductive: Unremarkable by CT. Other: No free intraperitoneal air. No free intra-abdominal fluid or hemoperitoneum. Musculoskeletal: LEFT-sided rib fractures involving ribs 7, 8 and 9 posteriorly without substantial displacement. LEFT seventh rib fracture is segmental with a lateral fracture also noted. No signs of costochondral injury. Sternum is intact. No displaced fractures of RIGHT-sided ribs. Visualized clavicles and scapulae are intact. No fracture of the bony pelvis Spinal degenerative changes.  Sternum is intact. IMPRESSION: 1. Tiny LEFT anterior and medial pneumothorax. 2. LEFT-sided rib fractures involving ribs 7, 8 and 9 posteriorly without substantial displacement. LEFT seventh rib fracture is segmental as described. 3. No signs of acute traumatic injury to the abdomen or pelvis. These results were called by telephone at the time of interpretation on 09/25/2021 at 5:29 pm to provider Dr. Wilkie AyeHorton, Who verbally acknowledged these results. Electronically Signed   By: Donzetta KohutGeoffrey  Wile M.D.   On: 09/25/2021 17:30   DG Chest Port 1 View  Result Date: 09/26/2021 CLINICAL DATA:  Rib fractures. EXAM: PORTABLE CHEST 1 VIEW COMPARISON:  CT and portable chest both performed  yesterday. FINDINGS: Mild cardiomegaly. Central vessels are normal caliber. Stable mediastinal configuration. The lungs hypoinflated with increased linear atelectasis in the bases but no convincing pneumonia or other focal abnormality. Multilevel left ribcage fractures described previously are better demonstrated with CT. No measurable pneumothorax is seen. No pleural collections are evident. IMPRESSION: Increased linear atelectasis in the lower lung fields. No further or acute new abnormality. The CT demonstrated a trace left anterior/medial pneumothorax but it is not visible radiographically. Left ribcage fractures were better demonstrated on CT. Mild cardiomegaly. Electronically Signed   By: Almira BarKeith  Chesser M.D.   On: 09/26/2021 06:44   DG Chest Portable 1 View  Result Date: 09/25/2021 CLINICAL DATA:  Trauma, chest pain EXAM: PORTABLE CHEST 1 VIEW COMPARISON:  None. FINDINGS: Heart size and mediastinal contours are within normal limits. No suspicious pulmonary opacities identified. No pleural effusion or pneumothorax  visualized. No acute osseous abnormality appreciated. IMPRESSION: No acute intrathoracic process identified. Electronically Signed   By: Jannifer Hick M.D.   On: 09/25/2021 15:06   DG Shoulder Left  Result Date: 09/25/2021 CLINICAL DATA:  Left shoulder injury EXAM: LEFT SHOULDER - 2+ VIEW COMPARISON:  None. FINDINGS: There is no evidence of fracture or dislocation. There is no evidence of arthropathy or other focal bone abnormality. Soft tissues are unremarkable. IMPRESSION: No acute osseous abnormality identified. Electronically Signed   By: Jannifer Hick M.D.   On: 09/25/2021 15:02   DG Knee Right Port  Result Date: 09/25/2021 CLINICAL DATA:  Right knee injury. EXAM: PORTABLE RIGHT KNEE - 1-2 VIEW COMPARISON:  None. FINDINGS: No acute fracture or dislocation identified. Joint spaces are preserved. No large joint effusion. IMPRESSION: No acute osseous abnormality identified.  Electronically Signed   By: Jannifer Hick M.D.   On: 09/25/2021 15:02    Review of Systems  HENT:  Negative for ear discharge, ear pain, hearing loss and tinnitus.   Eyes:  Negative for photophobia and pain.  Respiratory:  Negative for cough and shortness of breath.   Cardiovascular:  Positive for chest pain.  Gastrointestinal:  Negative for abdominal pain, nausea and vomiting.  Genitourinary:  Negative for dysuria, flank pain, frequency and urgency.  Musculoskeletal:  Positive for arthralgias (Right knee, left elbow). Negative for back pain, myalgias and neck pain.  Neurological:  Negative for dizziness and headaches.  Hematological:  Does not bruise/bleed easily.  Psychiatric/Behavioral:  The patient is not nervous/anxious.   Blood pressure 123/79, pulse 73, temperature 98.2 F (36.8 C), temperature source Oral, resp. rate 16, height 5\' 6"  (1.676 m), weight 74.8 kg, SpO2 98 %. Physical Exam Constitutional:      General: He is not in acute distress.    Appearance: He is well-developed. He is not diaphoretic.  HENT:     Head: Normocephalic and atraumatic.  Eyes:     General: No scleral icterus.       Right eye: No discharge.        Left eye: No discharge.     Conjunctiva/sclera: Conjunctivae normal.  Cardiovascular:     Rate and Rhythm: Normal rate and regular rhythm.  Pulmonary:     Effort: Pulmonary effort is normal. No respiratory distress.  Musculoskeletal:     Cervical back: Normal range of motion.     Comments: RLE No traumatic wounds, ecchymosis, or rash  Mod TTP knee, medial<lateral  Medial knee swelling  Knee lax to AP stress and valgus stress  Sens DPN, SPN, TN intact  Motor EHL, ext, flex, evers 5/5  DP 2+, PT 2+, No significant edema  Skin:    General: Skin is warm and dry.  Neurological:     Mental Status: He is alert.  Psychiatric:        Mood and Affect: Mood normal.        Behavior: Behavior normal.    Assessment/Plan: Right knee pain -- Suspect  MCL injury at least, possibly more. Will get MRI, suspect he will benefit from hinged knee brace. F/u with Dr. in 2-3 weeks.    Blanchie Dessert, PA-C Orthopedic Surgery [phone]  [phone] 09/26/2021, 10:00 AM

## 2021-09-26 NOTE — Progress Notes (Signed)
Patient admitted to 6N-04. At time of admission patient is Aox4 and able to make needs known. Lung sounds clear bilaterally. Denies SOB. Reports 5/10 rib pain and right knee pain. Administered PRN oxycodone per request as well as ice pack per orders. Skin is clean, dry, and intact with exception of small scab to L elbow. Patient oriented to room and is resting comfortably at this time. Will continue to monitor.

## 2021-09-26 NOTE — TOC CAGE-AID Note (Signed)
Transition of Care [facility]) - CAGE-AID Screening   Patient Details  Name: Syed Zalar MRN: [id number] Date of Birth: [birth year]  Transition of Care [facility]) CM/SW Contact:    Zakara Parkey C Tarpley-Carter, [place] Phone Number: 09/26/2021, 10:23 AM   Clinical Narrative: Pt participated in [facility].  Pt stated he does not use substance or ETOH.  Pt was not offered resources, due to no usage of substance or ETOH.     Eddie Payette Tarpley-Carter, MSW, LCSW-A Pronouns:  She/Her/Hers [place] Transitions of Care Clinical Social Worker Direct Number:  [phone] [email or web address] [email or web address]  CAGE-AID Screening:    Have You Ever Felt You Ought to Cut Down on Your Drinking or Drug Use?: No Have People Annoyed You By [employer] Your Drinking Or Drug Use?: No Have You Felt Bad Or Guilty About Your Drinking Or Drug Use?: No Have You Ever Had a Drink or Used Drugs First Thing In The Morning to Steady Your Nerves or to Get Rid of a Hangover?: No CAGE-AID Score: 0  Substance Abuse Education Offered: No

## 2021-09-26 NOTE — Evaluation (Signed)
Occupational Therapy Evaluation Patient Details Name: Vincent Mccarthy MRN: [id number] DOB: November 24, 1970 Today's Date: 09/26/2021   History of Present Illness Pt is a 51 y/o male admitted following fall off of roof. Found to have L 7-9 rib fractures with small pneumothorax. Pt also with R knee and L shoulder pain, however, X-ray negative.   Clinical Impression   PTA Patient independent and working. Admitted for above and limited by problem list below, including L shoulder pain and decreased functional use, R knee pain, impaired balance and decreased activity tolerance.  Pt using RW able to complete mobility with min guard, transfers with min guard and ADLs with up to min assist; but noted unable to weight bear  on L LE and very reliant on UE support using walker. Educated on compensatory techniques and safety with ADLs.  Patient educated on exercises to L UE as tolerated.  Will follow acutely and recommend continued OP OT services at dc.      Recommendations for follow up therapy are one component of a multi-disciplinary discharge planning process, led by the attending physician.  Recommendations may be updated based on patient status, additional functional criteria and insurance authorization.   Follow Up Recommendations  Outpatient OT    Assistance Recommended at Discharge Intermittent Supervision/Assistance  Patient can return home with the following A little help with walking and/or transfers;A little help with bathing/dressing/bathroom;Assistance with cooking/housework    Functional Status Assessment  Patient has had a recent decline in their functional status and demonstrates the ability to make significant improvements in function in a reasonable and predictable amount of time.  Equipment Recommendations  BSC/3in1;Other (comment) (RW)    Recommendations for Other Services       Precautions / Restrictions Precautions Precautions: Fall Restrictions Weight Bearing  Restrictions: No      Mobility Bed Mobility Overal bed mobility: Modified Independent                  Transfers Overall transfer level: Needs assistance Equipment used: Rolling walker (2 wheels) Transfers: Sit to/from Stand Sit to Stand: Min guard           General transfer comment: Min guard for safety. Cues for kicking RLE to help with pain management. Little to no weight placed on RLE      Balance Overall balance assessment: Needs assistance Sitting-balance support: No upper extremity supported, Feet supported Sitting balance-Leahy Scale: Fair     Standing balance support: Bilateral upper extremity supported, During functional activity Standing balance-Leahy Scale: Poor Standing balance comment: Reliant on BUE support                           ADL either performed or assessed with clinical judgement   ADL Overall ADL's : Needs assistance/impaired     Grooming: Set up;Sitting           Upper Body Dressing : Minimal assistance;Sitting   Lower Body Dressing: Minimal assistance;Sit to/from stand   Toilet Transfer: Min guard;Ambulation;Rolling walker (2 wheels)   Toileting- Clothing Manipulation and Hygiene: Sit to/from stand;Minimal assistance       Functional mobility during ADLs: Min guard;Rolling walker (2 wheels);Cueing for safety General ADL Comments: pt ambulated in room using RW, limited by pain in R LE     Vision   Vision Assessment?: No apparent visual deficits     Perception     Praxis      Pertinent Vitals/Pain Pain Assessment Pain Assessment: Faces Faces  Pain Scale: Hurts even more Pain Location: L shoulder, L ribs, R knee Pain Descriptors / Indicators: Grimacing, Guarding Pain Intervention(s): Limited activity within patient's tolerance, Monitored during session, Repositioned     Hand Dominance Right   Extremity/Trunk Assessment Upper Extremity Assessment Upper Extremity Assessment: LUE deficits/detail LUE  Deficits / Details: AROM to 15*, PROM to 30*; able to elevate shoulder but not retract without pain. WFL elbow and distal.  No sensation changes. LUE: Unable to fully assess due to pain LUE Sensation: WNL LUE Coordination: decreased gross motor   Lower Extremity Assessment Lower Extremity Assessment: Defer to PT evaluation RLE Deficits / Details: R knee swollen and in slightly valgus position. Pt reports instability with weightbearing.   Cervical / Trunk Assessment Cervical / Trunk Assessment: Normal   Communication Communication Communication: No difficulties   Cognition Arousal/Alertness: Awake/alert Behavior During Therapy: WFL for tasks assessed/performed Overall Cognitive Status: Within Functional Limits for tasks assessed                                       General Comments  educated on safety and compensatory techniques with ADLs seated, donning R LE or L UE first.    Exercises Exercises: Other exercises Other Exercises Other Exercises: encouraged L shoulder ROM as tolerated including lap slides and SROM using R UE to assist LUE   Shoulder Instructions      Home Living Family/patient expects to be discharged to:: Private residence Living Arrangements: Spouse/significant other;Children Available Help at Discharge: Family Type of Home: House Home Access: Stairs to enter Secretary/administratorntrance Stairs-Number of Steps: 2 Entrance Stairs-Rails: None Home Layout: One level     Bathroom Shower/Tub: Chief Strategy OfficerTub/shower unit   Bathroom Toilet: Standard     Home Equipment: None          Prior Functioning/Environment Prior Level of Function : Independent/Modified Independent;Working/employed                        OT Problem List: Decreased strength;Decreased range of motion;Decreased activity tolerance;Impaired balance (sitting and/or standing);Decreased coordination;Decreased knowledge of use of DME or AE;Decreased knowledge of precautions;Pain;Impaired UE  functional use      OT Treatment/Interventions: Self-care/ADL training;Therapeutic exercise;DME and/or AE instruction;Therapeutic activities;Patient/family education;Balance training    OT Goals(Current goals can be found in the care plan section) Acute Rehab OT Goals Patient Stated Goal: less pain OT Goal Formulation: With patient Time For Goal Achievement: 10/10/21 Potential to Achieve Goals: Good  OT Frequency: Min 2X/week    Co-evaluation PT/OT/SLP Co-Evaluation/Treatment: Yes Reason for Co-Treatment: To address functional/ADL transfers;For patient/therapist safety PT goals addressed during session: Mobility/safety with mobility;Balance;Proper use of DME OT goals addressed during session: ADL's and self-care      AM-PAC OT "6 Clicks" Daily Activity     Outcome Measure Help from another person eating meals?: A Little Help from another person taking care of personal grooming?: A Little Help from another person toileting, which includes using toliet, bedpan, or urinal?: A Little Help from another person bathing (including washing, rinsing, drying)?: A Little Help from another person to put on and taking off regular upper body clothing?: A Little Help from another person to put on and taking off regular lower body clothing?: A Little 6 Click Score: 18   End of Session Equipment Utilized During Treatment: Gait belt;Rolling walker (2 wheels) Nurse Communication: Mobility status  Activity Tolerance: Patient tolerated treatment  well Patient left: with call bell/phone within reach;in bed;with family/visitor present  OT Visit Diagnosis: Other abnormalities of gait and mobility (R26.89);Muscle weakness (generalized) (M62.81);Pain Pain - Right/Left: Left Pain - part of body: Shoulder (R knee)                Time: 0109-3235 OT Time Calculation (min): 13 min Charges:  OT General Charges $OT Visit: 1 Visit OT Evaluation $OT Eval Moderate Complexity: 1 Mod  Barry Brunner, OT Acute  Rehabilitation Services Pager [phone] Office [phone]   Chancy Milroy 09/26/2021, 11:26 AM

## 2021-09-26 NOTE — Progress Notes (Signed)
[facility] Progress Note     Subjective: CC:  Some chest wall pain with inspiration. States this improves with tylenol. Also c/o ongoing LUE pain and R knee pain. R knee is bothering him the most - has ambulated to bathroom w walker but is unable to fully flex or bear weight due to pain and feeling of instability. Denies abd pain, nausea, or vomiting.  Confirms that he works in [employer] and fell at work.   Objective: Vital signs in last 24 hours: Temp:  [97.9 F (36.6 C)-98.6 F (37 C)] 98.2 F (36.8 C) (01/25 0746) Pulse Rate:  [73-102] 73 (01/25 0746) Resp:  [16-32] 16 (01/25 0746) BP: (122-146)/(77-92) 123/79 (01/25 0746) SpO2:  [96 %-99 %] 98 % (01/25 0746) Weight:  [74.8 kg] 74.8 kg (01/24 1446)    Intake/Output from previous day: No intake/output data recorded. Intake/Output this shift: No intake/output data recorded.  PE: General:  Alert, NAD, pleasant, comfortable Head: normocephalic, atraumatic, anicteric sclerae  CV: RRR, no m/r/g, no peripheral edema  Pulm: appropriately tender left lateral chest wall without crepitus, CTAB, no wheezes or rales  Abd:  Soft, non-tender, non-distended, no HSM MSK: mild swelling over L elbow, L elbow abrasion, no bony tenderness, AROM in tact - pain in upper arm with flexion Ecchymosis and swelling of R medial knee, TTP, unable to perform active knee flexion fully due to pain, no pain with knee extension No deformity or pain of RUE, LLE. All extremities are NVI. No hip pain bilaterally. Psych: A&Ox3  Lab Results:  Recent Labs    09/25/21 1432 09/25/21 1440 09/26/21 0548  WBC 14.7*  --  11.8*  HGB 14.7 15.0 14.4  HCT 44.2 44.0 41.3  PLT 269  --  232   BMET Recent Labs    09/25/21 1440 09/26/21 0548  NA 138 135  K 3.2* 3.6  CL 102 101  CO2  --  24  GLUCOSE 133* 102*  BUN 17 11  CREATININE 1.10 0.82  CALCIUM  --  8.7*   PT/INR No results for input(s): LABPROT, INR in the last 72 hours. CMP      Component Value Date/Time   NA 135 09/26/2021 0548   K 3.6 09/26/2021 0548   CL 101 09/26/2021 0548   CO2 24 09/26/2021 0548   GLUCOSE 102 (H) 09/26/2021 0548   BUN 11 09/26/2021 0548   CREATININE 0.82 09/26/2021 0548   CALCIUM 8.7 (L) 09/26/2021 0548   GFRNONAA >60 09/26/2021 0548   Lipase  No results found for: LIPASE     Studies/Results: DG Elbow 2 Views Left  Result Date: 09/25/2021 CLINICAL DATA:  Left elbow injury EXAM: LEFT ELBOW - 2 VIEW COMPARISON:  None. FINDINGS: There is no evidence of fracture, dislocation, or joint effusion. There is no evidence of arthropathy or other focal bone abnormality. Soft tissues are unremarkable. IMPRESSION: No acute osseous abnormality identified. Electronically Signed   By: Jannifer Hickelaney  Williams M.D.   On: 09/25/2021 15:05   CT Head Wo Contrast  Result Date: 09/25/2021 CLINICAL DATA:  Status post fall from 20 feet. EXAM: CT HEAD WITHOUT CONTRAST CT CERVICAL SPINE WITHOUT CONTRAST TECHNIQUE: Multidetector CT imaging of the head and cervical spine was performed following the standard protocol without intravenous contrast. Multiplanar CT image reconstructions of the cervical spine were also generated. RADIATION DOSE REDUCTION: This exam was performed according to the departmental dose-optimization program which includes automated exposure control, adjustment of the mA and/or kV according to patient size and/or  use of iterative reconstruction technique. COMPARISON:  None. FINDINGS: CT HEAD FINDINGS Brain: No evidence of acute infarction, hemorrhage, hydrocephalus, extra-axial collection or mass lesion/mass effect. Vascular: No hyperdense vessel or unexpected calcification. Skull: No osseous abnormality. Sinuses/Orbits: Mucosal thickening of bilateral maxillary sinuses and ethmoid sinuses. Visualized mastoid sinuses are clear. Visualized orbits demonstrate no focal abnormality. Other: None CT CERVICAL SPINE FINDINGS Alignment: Normal. Skull base and  vertebrae: No acute fracture. No primary bone lesion or focal pathologic process. Soft tissues and spinal canal: No prevertebral fluid or swelling. No visible canal hematoma. Disc levels: Degenerative disease with disc height loss at C4-5, C5-6 and C6-7 with bilateral uncovertebral degenerative changes and foraminal narrowing. Upper chest: Lung apices are clear. Other: No fluid collection or hematoma. IMPRESSION: 1. No acute intracranial pathology. 2.  No acute osseous injury of the cervical spine. Electronically Signed   By: Elige Ko M.D.   On: 09/25/2021 15:41   CT Cervical Spine Wo Contrast  Result Date: 09/25/2021 CLINICAL DATA:  Status post fall from 20 feet. EXAM: CT HEAD WITHOUT CONTRAST CT CERVICAL SPINE WITHOUT CONTRAST TECHNIQUE: Multidetector CT imaging of the head and cervical spine was performed following the standard protocol without intravenous contrast. Multiplanar CT image reconstructions of the cervical spine were also generated. RADIATION DOSE REDUCTION: This exam was performed according to the departmental dose-optimization program which includes automated exposure control, adjustment of the mA and/or kV according to patient size and/or use of iterative reconstruction technique. COMPARISON:  None. FINDINGS: CT HEAD FINDINGS Brain: No evidence of acute infarction, hemorrhage, hydrocephalus, extra-axial collection or mass lesion/mass effect. Vascular: No hyperdense vessel or unexpected calcification. Skull: No osseous abnormality. Sinuses/Orbits: Mucosal thickening of bilateral maxillary sinuses and ethmoid sinuses. Visualized mastoid sinuses are clear. Visualized orbits demonstrate no focal abnormality. Other: None CT CERVICAL SPINE FINDINGS Alignment: Normal. Skull base and vertebrae: No acute fracture. No primary bone lesion or focal pathologic process. Soft tissues and spinal canal: No prevertebral fluid or swelling. No visible canal hematoma. Disc levels: Degenerative disease with disc  height loss at C4-5, C5-6 and C6-7 with bilateral uncovertebral degenerative changes and foraminal narrowing. Upper chest: Lung apices are clear. Other: No fluid collection or hematoma. IMPRESSION: 1. No acute intracranial pathology. 2.  No acute osseous injury of the cervical spine. Electronically Signed   By: Elige Ko M.D.   On: 09/25/2021 15:41   CT CHEST ABDOMEN PELVIS W CONTRAST  Result Date: 09/25/2021 CLINICAL DATA:  Abdominal trauma, blunt abdominal trauma in a 51 year old male. Fall 20 feet by report. EXAM: CT CHEST, ABDOMEN, AND PELVIS WITH CONTRAST TECHNIQUE: Multidetector CT imaging of the chest, abdomen and pelvis was performed following the standard protocol during bolus administration of intravenous contrast. RADIATION DOSE REDUCTION: This exam was performed according to the departmental dose-optimization program which includes automated exposure control, adjustment of the mA and/or kV according to patient size and/or use of iterative reconstruction technique. CONTRAST:  OMNIPAQUE IOHEXOL 350 MG/ML SOLN COMPARISON:  None. FINDINGS: CT CHEST FINDINGS Cardiovascular: Heart size is normal without pericardial effusion. Aortic caliber is normal. Contour of the thoracic aorta is smooth. No periaortic stranding. Central pulmonary vasculature unremarkable on venous phase. Mediastinum/Nodes: Thoracic inlet structures are normal. No mediastinal hematoma. No adenopathy in the chest. Lungs/Pleura: Tiny LEFT anterior and medial pneumothorax. No consolidation or evidence of pleural effusion. Airways are patent. Minimal volume loss at the lung bases Musculoskeletal: See below for full musculoskeletal details. No substantial body wall contusion. CT ABDOMEN PELVIS FINDINGS Hepatobiliary: Liver  with smooth contours. No signs of hepatic trauma or focal, suspicious hepatic lesion. Portal vein is patent. No pericholecystic stranding or signs of biliary duct distension. Pancreas: Normal, without mass,  inflammation or ductal dilatation. Spleen: Spleen with smooth contours aside from small cleft in the cephalad spleen. No perisplenic stranding or signs of splenic laceration. No perisplenic fluid. Adrenals/Urinary Tract: Adrenal glands are unremarkable. Symmetric renal enhancement. No sign of hydronephrosis. No suspicious renal lesion or perinephric stranding. Urinary bladder is grossly unremarkable. Stomach/Bowel: No acute gastrointestinal process. The appendix is normal Vascular/Lymphatic: Aorta with smooth contours. No stranding adjacent to the aorta. No adenopathy in the abdomen. No adenopathy in the pelvis Reproductive: Unremarkable by CT. Other: No free intraperitoneal air. No free intra-abdominal fluid or hemoperitoneum. Musculoskeletal: LEFT-sided rib fractures involving ribs 7, 8 and 9 posteriorly without substantial displacement. LEFT seventh rib fracture is segmental with a lateral fracture also noted. No signs of costochondral injury. Sternum is intact. No displaced fractures of RIGHT-sided ribs. Visualized clavicles and scapulae are intact. No fracture of the bony pelvis Spinal degenerative changes.  Sternum is intact. IMPRESSION: 1. Tiny LEFT anterior and medial pneumothorax. 2. LEFT-sided rib fractures involving ribs 7, 8 and 9 posteriorly without substantial displacement. LEFT seventh rib fracture is segmental as described. 3. No signs of acute traumatic injury to the abdomen or pelvis. These results were called by telephone at the time of interpretation on 09/25/2021 at 5:29 pm to provider Dr. Wilkie Aye, Who verbally acknowledged these results. Electronically Signed   By: Donzetta Kohut M.D.   On: 09/25/2021 17:30   DG Chest Port 1 View  Result Date: 09/26/2021 CLINICAL DATA:  Rib fractures. EXAM: PORTABLE CHEST 1 VIEW COMPARISON:  CT and portable chest both performed yesterday. FINDINGS: Mild cardiomegaly. Central vessels are normal caliber. Stable mediastinal configuration. The lungs hypoinflated  with increased linear atelectasis in the bases but no convincing pneumonia or other focal abnormality. Multilevel left ribcage fractures described previously are better demonstrated with CT. No measurable pneumothorax is seen. No pleural collections are evident. IMPRESSION: Increased linear atelectasis in the lower lung fields. No further or acute new abnormality. The CT demonstrated a trace left anterior/medial pneumothorax but it is not visible radiographically. Left ribcage fractures were better demonstrated on CT. Mild cardiomegaly. Electronically Signed   By: Almira Bar M.D.   On: 09/26/2021 06:44   DG Chest Portable 1 View  Result Date: 09/25/2021 CLINICAL DATA:  Trauma, chest pain EXAM: PORTABLE CHEST 1 VIEW COMPARISON:  None. FINDINGS: Heart size and mediastinal contours are within normal limits. No suspicious pulmonary opacities identified. No pleural effusion or pneumothorax visualized. No acute osseous abnormality appreciated. IMPRESSION: No acute intrathoracic process identified. Electronically Signed   By: Jannifer Hick M.D.   On: 09/25/2021 15:06   DG Shoulder Left  Result Date: 09/25/2021 CLINICAL DATA:  Left shoulder injury EXAM: LEFT SHOULDER - 2+ VIEW COMPARISON:  None. FINDINGS: There is no evidence of fracture or dislocation. There is no evidence of arthropathy or other focal bone abnormality. Soft tissues are unremarkable. IMPRESSION: No acute osseous abnormality identified. Electronically Signed   By: Jannifer Hick M.D.   On: 09/25/2021 15:02   DG Knee Right Port  Result Date: 09/25/2021 CLINICAL DATA:  Right knee injury. EXAM: PORTABLE RIGHT KNEE - 1-2 VIEW COMPARISON:  None. FINDINGS: No acute fracture or dislocation identified. Joint spaces are preserved. No large joint effusion. IMPRESSION: No acute osseous abnormality identified. Electronically Signed   By: Philis Kendall.D.  On: 09/25/2021 15:02    Anti-infectives: Anti-infectives (From admission, onward)     None      Assessment/Plan Fall 20 ft onto L side without LOC L Rib FX 7-9 - IS/pulm toilet, multimodal pain control.  Tiny L PTX - not visible on follow up CXR today  LUE pain - no acute fracture, full ROM R knee pain - orthopedic surgery consult for suspected ligamentous injury. MRI ordered. Appreciate ortho recs. Outpatient follow up in 2-3 weeks.   FEN: Reg, saline lock IV VTE: SCD's, Lovenoc Foley: none Dispo: med-surg, PT/OT, anticipate discharge today or tomorrow pending ortho eval and mobilization    LOS: 0 days   I reviewed Consultant orthopedic notes, last 24 h vitals and pain scores, last 48 h intake and output, last 24 h labs and trends, and last 24 h imaging results.  This care required moderate level of medical decision making.   Hosie SpangleElizabeth Ashlyne Olenick, PA-C [facility] Please see Amion for pager number during day hours 7:00am-4:30pm

## 2021-09-26 NOTE — Progress Notes (Signed)
TRN rounded on patient, OTF in radiology. Family in room.

## 2021-09-26 NOTE — Progress Notes (Signed)
Orthopedic Tech Progress Note Patient Details:  Kyrel Brees 03/30/1971 [id number]  Called in order to [facility] for a ROM BRACE LOCKED   Patient ID: Tolan Massingale, male   DOB: [birth year], 51 y.o.   MRN: [id number]  Janit Pagan 09/26/2021, 4:17 PM

## 2021-09-26 NOTE — Discharge Summary (Addendum)
[facility] Discharge Summary   Patient ID: Vincent Mccarthy MRN: [id number] DOB/AGE: 50/03/1971 50 y.o.  Admit date: 09/25/2021 Discharge date: 09/27/2021  Admitting Diagnosis: Fall Rib FX Knee pain  Discharge Diagnosis Patient Active Problem List   Diagnosis Date Noted   Rib fractures 09/25/2021   Consultants Orthopedic surgery   Imaging: DG Elbow 2 Views Left  Result Date: 09/25/2021 CLINICAL DATA:  Left elbow injury EXAM: LEFT ELBOW - 2 VIEW COMPARISON:  None. FINDINGS: There is no evidence of fracture, dislocation, or joint effusion. There is no evidence of arthropathy or other focal bone abnormality. Soft tissues are unremarkable. IMPRESSION: No acute osseous abnormality identified. Electronically Signed   By: Jannifer Hickelaney  Williams M.D.   On: 09/25/2021 15:05   CT Head Wo Contrast  Result Date: 09/25/2021 CLINICAL DATA:  Status post fall from 20 feet. EXAM: CT HEAD WITHOUT CONTRAST CT CERVICAL SPINE WITHOUT CONTRAST TECHNIQUE: Multidetector CT imaging of the head and cervical spine was performed following the standard protocol without intravenous contrast. Multiplanar CT image reconstructions of the cervical spine were also generated. RADIATION DOSE REDUCTION: This exam was performed according to the departmental dose-optimization program which includes automated exposure control, adjustment of the mA and/or kV according to patient size and/or use of iterative reconstruction technique. COMPARISON:  None. FINDINGS: CT HEAD FINDINGS Brain: No evidence of acute infarction, hemorrhage, hydrocephalus, extra-axial collection or mass lesion/mass effect. Vascular: No hyperdense vessel or unexpected calcification. Skull: No osseous abnormality. Sinuses/Orbits: Mucosal thickening of bilateral maxillary sinuses and ethmoid sinuses. Visualized mastoid sinuses are clear. Visualized orbits demonstrate no focal abnormality. Other: None CT CERVICAL SPINE FINDINGS Alignment: Normal.  Skull base and vertebrae: No acute fracture. No primary bone lesion or focal pathologic process. Soft tissues and spinal canal: No prevertebral fluid or swelling. No visible canal hematoma. Disc levels: Degenerative disease with disc height loss at C4-5, C5-6 and C6-7 with bilateral uncovertebral degenerative changes and foraminal narrowing. Upper chest: Lung apices are clear. Other: No fluid collection or hematoma. IMPRESSION: 1. No acute intracranial pathology. 2.  No acute osseous injury of the cervical spine. Electronically Signed   By: Elige KoHetal  Patel M.D.   On: 09/25/2021 15:41   CT Cervical Spine Wo Contrast  Result Date: 09/25/2021 CLINICAL DATA:  Status post fall from 20 feet. EXAM: CT HEAD WITHOUT CONTRAST CT CERVICAL SPINE WITHOUT CONTRAST TECHNIQUE: Multidetector CT imaging of the head and cervical spine was performed following the standard protocol without intravenous contrast. Multiplanar CT image reconstructions of the cervical spine were also generated. RADIATION DOSE REDUCTION: This exam was performed according to the departmental dose-optimization program which includes automated exposure control, adjustment of the mA and/or kV according to patient size and/or use of iterative reconstruction technique. COMPARISON:  None. FINDINGS: CT HEAD FINDINGS Brain: No evidence of acute infarction, hemorrhage, hydrocephalus, extra-axial collection or mass lesion/mass effect. Vascular: No hyperdense vessel or unexpected calcification. Skull: No osseous abnormality. Sinuses/Orbits: Mucosal thickening of bilateral maxillary sinuses and ethmoid sinuses. Visualized mastoid sinuses are clear. Visualized orbits demonstrate no focal abnormality. Other: None CT CERVICAL SPINE FINDINGS Alignment: Normal. Skull base and vertebrae: No acute fracture. No primary bone lesion or focal pathologic process. Soft tissues and spinal canal: No prevertebral fluid or swelling. No visible canal hematoma. Disc levels: Degenerative  disease with disc height loss at C4-5, C5-6 and C6-7 with bilateral uncovertebral degenerative changes and foraminal narrowing. Upper chest: Lung apices are clear. Other: No fluid collection or hematoma. IMPRESSION: 1. No acute intracranial pathology. 2.  No acute osseous injury of the cervical spine. Electronically Signed   By: Elige Ko M.D.   On: 09/25/2021 15:41   CT KNEE RIGHT WO CONTRAST  Result Date: 09/26/2021 CLINICAL DATA:  Knee trauma. EXAM: CT OF THE LOWER RIGHT EXTREMITY WITHOUT CONTRAST TECHNIQUE: Multidetector CT imaging of the right lower extremity was performed according to the standard protocol. RADIATION DOSE REDUCTION: This exam was performed according to the departmental dose-optimization program which includes automated exposure control, adjustment of the mA and/or kV according to patient size and/or use of iterative reconstruction technique. COMPARISON:  Right knee radiograph dated 09/25/2021. FINDINGS: Bones/Joint/Cartilage There is a depressed fracture of the posterior lateral tibial plateau. No other acute fracture. No dislocation. The bones are well mineralized. Small suprapatellar effusion. Ligaments Suboptimally assessed by CT. Muscles and Tendons No acute findings. Soft tissues Diffuse subcutaneous edema and swelling of the knee. No fluid collection. IMPRESSION: Depressed fracture of the posterior lateral tibial plateau. Electronically Signed   By: Elgie Collard M.D.   On: 09/26/2021 21:43   MR SHOULDER LEFT WO CONTRAST  Result Date: 09/27/2021 CLINICAL DATA:  Status post fall from the root. Shoulder pain. EXAM: MRI OF THE LEFT SHOULDER WITHOUT CONTRAST TECHNIQUE: Multiplanar, multisequence MR imaging of the shoulder was performed. No intravenous contrast was administered. COMPARISON:  None. FINDINGS: Rotator cuff: Complete tear of the supraspinatus tendon with 4 cm of retraction. Mild tendinosis of the infraspinatus tendon. Teres minor tendon is intact. Subscapularis  tendon is intact. Muscles: No muscle atrophy or edema. No intramuscular fluid collection or hematoma. Biceps Long Head: Tendinosis of the intra-articular portion of the long head of the biceps tendon with a partial-thickness tear. Acromioclavicular Joint: Moderate arthropathy of the acromioclavicular joint. Trace subacromial/subdeltoid bursal fluid. Glenohumeral Joint: Moderate joint effusion.  No chondral defect. Labrum: Grossly intact, but evaluation is limited by lack of intraarticular fluid/contrast. Bones: No fracture or dislocation. No aggressive osseous lesion. Subcortical reactive marrow edema in the greater tuberosity. Subcortical reactive marrow edema in the lesser tuberosity. Other: No fluid collection or hematoma. IMPRESSION: 1. Complete tear of the supraspinatus tendon with 4 cm of retraction. 2. Mild tendinosis of the infraspinatus tendon. 3. Tendinosis of the intra-articular portion of the long head of the biceps tendon with a partial-thickness tear. Electronically Signed   By: Elige Ko M.D.   On: 09/27/2021 07:53   MR KNEE RIGHT WO CONTRAST  Result Date: 09/26/2021 CLINICAL DATA:  Larey Seat off a roof.  Injured knee. EXAM: MRI OF THE RIGHT KNEE WITHOUT CONTRAST TECHNIQUE: Multiplanar, multisequence MR imaging of the knee was performed. No intravenous contrast was administered. COMPARISON:  Radiographs 09/25/2021 FINDINGS: MENISCI Medial meniscus: Large oblique coursing inferior articular surface tear involving the posterior horn mid body junction region Lateral meniscus: The a extensively torn posterior horn and mid body region. LIGAMENTS Cruciates:  The ACL and PCL are both torn midsubstance. Collaterals: The MCL complex is completely ruptured at the joint level. The LCL complex is intact. CARTILAGE Patellofemoral:  Normal Medial:  Intact Lateral: Depressed lateral tibial plateau fracture and associated cartilage disruption. Maximum depression is 4.5 mm. Joint:  Large joint effusion and  Lipohemarthrosis. Popliteal Fossa:  No popliteal mass or Baker's cyst. Extensor Mechanism: The patella retinacular structures are intact. There is partial-thickness tearing of the patellar tendon mainly along the medial component. The quadriceps tendon is intact. Bones: Depressed focal die punch type lateral tibial plateau fracture posteriorly. Maximum depression is approximately 4.5 mm. There is also a focal impaction type injury  and small bone contusion involving the posterior aspect of the lateral femoral condyle. Other: The popliteus tendon is intact. The arcuate ligament is torn. IMPRESSION: 1. Significant complex knee injury including depressed lateral tibial plateau fracture, ACL, PCL and MCL ruptures, medial and lateral meniscus tears, arcuate ligament rupture and partial tearing of the patellar tendon. 2. Large joint effusion/lipohemarthrosis. Electronically Signed   By: Rudie Meyer M.D.   On: 09/26/2021 12:05   CT CHEST ABDOMEN PELVIS W CONTRAST  Result Date: 09/25/2021 CLINICAL DATA:  Abdominal trauma, blunt abdominal trauma in a 51 year old male. Fall 20 feet by report. EXAM: CT CHEST, ABDOMEN, AND PELVIS WITH CONTRAST TECHNIQUE: Multidetector CT imaging of the chest, abdomen and pelvis was performed following the standard protocol during bolus administration of intravenous contrast. RADIATION DOSE REDUCTION: This exam was performed according to the departmental dose-optimization program which includes automated exposure control, adjustment of the mA and/or kV according to patient size and/or use of iterative reconstruction technique. CONTRAST:  OMNIPAQUE IOHEXOL 350 MG/ML SOLN COMPARISON:  None. FINDINGS: CT CHEST FINDINGS Cardiovascular: Heart size is normal without pericardial effusion. Aortic caliber is normal. Contour of the thoracic aorta is smooth. No periaortic stranding. Central pulmonary vasculature unremarkable on venous phase. Mediastinum/Nodes: Thoracic inlet structures are  normal. No mediastinal hematoma. No adenopathy in the chest. Lungs/Pleura: Tiny LEFT anterior and medial pneumothorax. No consolidation or evidence of pleural effusion. Airways are patent. Minimal volume loss at the lung bases Musculoskeletal: See below for full musculoskeletal details. No substantial body wall contusion. CT ABDOMEN PELVIS FINDINGS Hepatobiliary: Liver with smooth contours. No signs of hepatic trauma or focal, suspicious hepatic lesion. Portal vein is patent. No pericholecystic stranding or signs of biliary duct distension. Pancreas: Normal, without mass, inflammation or ductal dilatation. Spleen: Spleen with smooth contours aside from small cleft in the cephalad spleen. No perisplenic stranding or signs of splenic laceration. No perisplenic fluid. Adrenals/Urinary Tract: Adrenal glands are unremarkable. Symmetric renal enhancement. No sign of hydronephrosis. No suspicious renal lesion or perinephric stranding. Urinary bladder is grossly unremarkable. Stomach/Bowel: No acute gastrointestinal process. The appendix is normal Vascular/Lymphatic: Aorta with smooth contours. No stranding adjacent to the aorta. No adenopathy in the abdomen. No adenopathy in the pelvis Reproductive: Unremarkable by CT. Other: No free intraperitoneal air. No free intra-abdominal fluid or hemoperitoneum. Musculoskeletal: LEFT-sided rib fractures involving ribs 7, 8 and 9 posteriorly without substantial displacement. LEFT seventh rib fracture is segmental with a lateral fracture also noted. No signs of costochondral injury. Sternum is intact. No displaced fractures of RIGHT-sided ribs. Visualized clavicles and scapulae are intact. No fracture of the bony pelvis Spinal degenerative changes.  Sternum is intact. IMPRESSION: 1. Tiny LEFT anterior and medial pneumothorax. 2. LEFT-sided rib fractures involving ribs 7, 8 and 9 posteriorly without substantial displacement. LEFT seventh rib fracture is segmental as described. 3. No  signs of acute traumatic injury to the abdomen or pelvis. These results were called by telephone at the time of interpretation on 09/25/2021 at 5:29 pm to provider Dr. Wilkie Aye, Who verbally acknowledged these results. Electronically Signed   By: Donzetta Kohut M.D.   On: 09/25/2021 17:30   DG Chest Port 1 View  Result Date: 09/26/2021 CLINICAL DATA:  Rib fractures. EXAM: PORTABLE CHEST 1 VIEW COMPARISON:  CT and portable chest both performed yesterday. FINDINGS: Mild cardiomegaly. Central vessels are normal caliber. Stable mediastinal configuration. The lungs hypoinflated with increased linear atelectasis in the bases but no convincing pneumonia or other focal abnormality. Multilevel left ribcage fractures  described previously are better demonstrated with CT. No measurable pneumothorax is seen. No pleural collections are evident. IMPRESSION: Increased linear atelectasis in the lower lung fields. No further or acute new abnormality. The CT demonstrated a trace left anterior/medial pneumothorax but it is not visible radiographically. Left ribcage fractures were better demonstrated on CT. Mild cardiomegaly. Electronically Signed   By: Almira BarKeith  Chesser M.D.   On: 09/26/2021 06:44   DG Chest Portable 1 View  Result Date: 09/25/2021 CLINICAL DATA:  Trauma, chest pain EXAM: PORTABLE CHEST 1 VIEW COMPARISON:  None. FINDINGS: Heart size and mediastinal contours are within normal limits. No suspicious pulmonary opacities identified. No pleural effusion or pneumothorax visualized. No acute osseous abnormality appreciated. IMPRESSION: No acute intrathoracic process identified. Electronically Signed   By: Jannifer Hickelaney  Williams M.D.   On: 09/25/2021 15:06   DG Shoulder Left  Result Date: 09/25/2021 CLINICAL DATA:  Left shoulder injury EXAM: LEFT SHOULDER - 2+ VIEW COMPARISON:  None. FINDINGS: There is no evidence of fracture or dislocation. There is no evidence of arthropathy or other focal bone abnormality. Soft tissues are  unremarkable. IMPRESSION: No acute osseous abnormality identified. Electronically Signed   By: Jannifer Hickelaney  Williams M.D.   On: 09/25/2021 15:02   DG Knee Right Port  Result Date: 09/25/2021 CLINICAL DATA:  Right knee injury. EXAM: PORTABLE RIGHT KNEE - 1-2 VIEW COMPARISON:  None. FINDINGS: No acute fracture or dislocation identified. Joint spaces are preserved. No large joint effusion. IMPRESSION: No acute osseous abnormality identified. Electronically Signed   By: Jannifer Hickelaney  Williams M.D.   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  He landed on his left chest wall on a metal awning and then his right leg landed on cement below.  This occurred around 1:15 in the afternoon 09/25/21.  Denies loss of consciousness or head injury.  Initially complained of left elbow, shoulder, ribs and right knee pain.  Continues to complain of the same.  Work-up as below demonstrates rib fractures with tiny anterior medial pneumothorax.  Trauma surgery consulted for admission.  Hospital Course:   Admitted for pain control, observation, PT/OT. On hospital day #1 the patient had persistent RLE and LUE pain despite negative x-rays. Orthopedic surgery was consulted and recommended MRI of LUE, RLE, and CT LUE to evaluate for suspected ligamentous injury. Revealed multiple torn ligaments in R knee with a tibial plateau FX. Also showed left rotator cuff injury. Orthopedic surgery recommended a bledsoe brace and NWB of right lower extremity and a sling of LUE for comfort WBAT. Patient will follow up with Dr. Everardo PacificVarkey for surgical planning as below. Patient mobilized with PT/OT. On 09/27/21 vitals were stable, pain controlled on oral medications, tolerating PO, voiding without complaints, and felt stable for discharge home. He will require follow up as below and knows to call with questions or  concerns. Patient works in [employer] and will need to be out of work for a few weeks.   I have personally reviewed the patients medication history on the [place] controlled substance database.    Physical Exam: General:  Alert, NAD, pleasant, comfortable Head: normocephalic, atraumatic, anicteric sclerae  CV: RRR, no m/r/g, no peripheral edema  Pulm: appropriately tender left lateral chest wall without crepitus, CTAB, no wheezes or rales  Abd:  Soft, non-tender, non-distended, no HSM MSK: mild swelling over L elbow,  L elbow abrasion, no bony tenderness, AROM elbow in tact. Unable to perform full shoulder flexion due to pain. Ecchymosis and swelling of R knee, TTP, unable to perform active knee flexion fully due to pain, no pain with knee extension. Knee in hinged brace. No numbness or tingling of his toes/feet. No deformity or pain of RUE, LLE. All extremities are NVI. No hip pain bilaterally. Psych: A&Ox3  Allergies as of 09/27/2021   No Known Allergies      Medication List     TAKE these medications    acetaminophen 500 MG tablet Commonly known as: TYLENOL Take 1,000 mg by mouth every 6 (six) hours as needed for moderate pain or headache.   docusate sodium 100 MG capsule Commonly known as: COLACE Take 1 capsule (100 mg total) by mouth 2 (two) times daily.   ibuprofen 800 MG tablet Commonly known as: ADVIL Take 1 tablet (800 mg total) by mouth every 8 (eight) hours as needed.   lidocaine 5 % Commonly known as: LIDODERM Place 1 patch onto the skin daily. CHEST WALL. Remove & Discard patch within 12 hours or as directed by MD Start taking on: September 28, 2021   methocarbamol 500 MG tablet Commonly known as: ROBAXIN Take 1 tablet (500 mg total) by mouth every 8 (eight) hours as needed for muscle spasms (pain).   oxyCODONE 5 MG immediate release tablet Commonly known as: Oxy IR/ROXICODONE Take 1 tablet (5 mg total) by mouth every 6 (six) hours as needed for severe pain or  breakthrough pain.   prednisoLONE acetate 1 % ophthalmic suspension Commonly known as: PRED FORTE Place 1 drop into both eyes daily.   rosuvastatin 20 MG tablet Commonly known as: CRESTOR Take 20 mg by mouth at bedtime.   tamsulosin 0.4 MG Caps capsule Commonly known as: FLOMAX Take 0.4 mg by mouth at bedtime.               Durable Medical Equipment  (From admission, onward)           Start     Ordered   09/26/21 0944  For home use only DME Walker rolling  Once       Comments: To help patient transfer and ambulate.  Physical / Occupational Therapy may change type of walker PRN.  Question Answer Comment  Walker: With 5 Inch Wheels   Patient needs a walker to treat with the following condition Fall   Patient needs a walker to treat with the following condition Knee pain   Patient needs a walker to treat with the following condition Rib fractures      09/26/21 0944   09/26/21 0943  For home use only DME 3 n 1  Once        09/26/21 [id number]              Follow-up Information     Armanda Magic, FNP. Call.   Specialty: Family Medicine Why: As needed for further pain control for rib fractures Contact information: [address] [place] [address] [phone]         Bjorn Pippin, MD Follow up on 10/02/2021.   Specialty: Orthopedic Surgery Contact information: [address] [place] [address] [phone]                 Signed: Hosie Spangle, [facility] 09/27/2021, 11:46 AM

## 2021-09-26 NOTE — ED Notes (Signed)
ED TO INPATIENT HANDOFF REPORT  ED Nurse Name and Phone #: Skylier Kretschmer  S Name/Age/Gender Vincent Mccarthy 51 y.o. male Room/Bed: 049C/049C  Code Status   Code Status: Full Code  Home/SNF/Other Home Patient oriented to: self, place, time, and situation Is this baseline? Yes   Triage Complete: Triage complete  Chief Complaint Rib fractures [S22.49XA]  Triage Note Pt arrived via [facility] for a 20 ft fall from roof. When pt fell from roof, his left ribs hit metal awning and right leg hit cement. Pt denies hitting head or LOC. Pt c/o left elbow, shoulder, ribs and right knee pain. Pt arrived on a back board and c-collar. EMS gave fentanyl . Pt is A&Ox4. VSS   Allergies No Known Allergies  Level of Care/Admitting Diagnosis ED Disposition     ED Disposition  Admit   Condition  --   Comment  Hospital Area: [facility] [[id number]]  Level of Care: Med-Surg [16]  May place patient in observation at [facility] or [facility] if equivalent level of care is available:: No  Covid Evaluation: Asymptomatic Screening Protocol (No Symptoms)  Diagnosis: Rib fractures [098119]  Admitting Physician: TRAUMA MD [[id number]]  Attending Physician: TRAUMA MD [[id number]]          B Medical/Surgery History History reviewed. No pertinent past medical history. History reviewed. No pertinent surgical history.   A IV Location/Drains/Wounds Patient Lines/Drains/Airways Status     Active Line/Drains/Airways     Name Placement date Placement time Site Days   Peripheral IV 09/25/21 18 G Right Antecubital 09/25/21  --  Antecubital  1            Intake/Output Last 24 hours No intake or output data in the 24 hours ending 09/26/21 0341  Labs/Imaging Results for orders placed or performed during the hospital encounter of 09/25/21 (from the past 48 hour(s))  CBC     Status: Abnormal   Collection Time: 09/25/21  2:32 PM  Result Value Ref Range   WBC 14.7 (H) 4.0 - 10.5  K/uL   RBC 4.98 4.22 - 5.81 MIL/uL   Hemoglobin 14.7 13.0 - 17.0 g/dL   HCT 14.7 82.9 - 56.2 %   MCV 88.8 80.0 - 100.0 fL   MCH 29.5 26.0 - 34.0 pg   MCHC 33.3 30.0 - 36.0 g/dL   RDW 13.0 86.5 - 78.4 %   Platelets 269 150 - 400 K/uL   nRBC 0.0 0.0 - 0.2 %    Comment: Performed at [facility], [address] [place], [place] [address]  I-stat chem 8, ED (not at [facility] or [facility])     Status: Abnormal   Collection Time: 09/25/21  2:40 PM  Result Value Ref Range   Sodium 138 135 - 145 mmol/L   Potassium 3.2 (L) 3.5 - 5.1 mmol/L   Chloride 102 98 - 111 mmol/L   BUN 17 6 - 20 mg/dL   Creatinine, Ser 5.28 0.61 - 1.24 mg/dL   Glucose, Bld 413 (H) 70 - 99 mg/dL    Comment: Glucose reference range applies only to samples taken after fasting for at least 8 hours.   Calcium, Ion 1.02 (L) 1.15 - 1.40 mmol/L   TCO2 25 22 - 32 mmol/L   Hemoglobin 15.0 13.0 - 17.0 g/dL   HCT 24.4 01.0 - 27.2 %  HIV Antibody (routine testing w rflx)     Status: None   Collection Time: 09/25/21  6:29 PM  Result Value Ref  Range   HIV Screen 4th Generation wRfx Non Reactive Non Reactive    Comment: Performed at [facility], [address] [place], [place] [address]  Resp Panel by RT-PCR (Flu A&B, Covid) Nasopharyngeal Swab     Status: None   Collection Time: 09/25/21  9:16 PM   Specimen: Nasopharyngeal Swab; Nasopharyngeal(NP) swabs in vial transport medium  Result Value Ref Range   SARS Coronavirus 2 by RT PCR NEGATIVE NEGATIVE    Comment: (NOTE) SARS-CoV-2 target nucleic acids are NOT DETECTED.  The SARS-CoV-2 RNA is generally detectable in upper respiratory specimens during the acute phase of infection. The lowest concentration of SARS-CoV-2 viral copies this assay can detect is 138 copies/mL. A negative result does not preclude SARS-Cov-2 infection and should not be used as the sole basis for treatment or other patient management decisions. A negative result may occur with  improper specimen  collection/handling, submission of specimen other than nasopharyngeal swab, presence of viral mutation(s) within the areas targeted by this assay, and inadequate number of viral copies(<138 copies/mL). A negative result must be combined with clinical observations, patient history, and epidemiological information. The expected result is Negative.  Fact Sheet for Patients:  BloggerCourse.comhttps://www.fda.gov/media/152166/download  Fact Sheet for Healthcare Providers:  SeriousBroker.ithttps://www.fda.gov/media/152162/download  This test is no t yet approved or cleared by the [place] FDA and  has been authorized for detection and/or diagnosis of SARS-CoV-2 by FDA under an Emergency Use Authorization (EUA). This EUA will remain  in effect (meaning this test can be used) for the duration of the COVID-19 declaration under Section 564(b)(1) of the Act, 21 U.S.C.section 360bbb-3(b)(1), unless the authorization is terminated  or revoked sooner.       Influenza A by PCR NEGATIVE NEGATIVE   Influenza B by PCR NEGATIVE NEGATIVE    Comment: (NOTE) The Xpert Xpress SARS-CoV-2/FLU/RSV plus assay is intended as an aid in the diagnosis of influenza from Nasopharyngeal swab specimens and should not be used as a sole basis for treatment. Nasal washings and aspirates are unacceptable for Xpert Xpress SARS-CoV-2/FLU/RSV testing.  Fact Sheet for Patients: BloggerCourse.comhttps://www.fda.gov/media/152166/download  Fact Sheet for Healthcare Providers: SeriousBroker.ithttps://www.fda.gov/media/152162/download  This test is not yet approved or cleared by the [place] FDA and has been authorized for detection and/or diagnosis of SARS-CoV-2 by FDA under an Emergency Use Authorization (EUA). This EUA will remain in effect (meaning this test can be used) for the duration of the COVID-19 declaration under Section 564(b)(1) of the Act, 21 U.S.C. section 360bbb-3(b)(1), unless the authorization is terminated or revoked.  Performed at [facility], [address] [place], [place] [address]    DG Elbow 2 Views Left  Result Date: 09/25/2021 CLINICAL DATA:  Left elbow injury EXAM: LEFT ELBOW - 2 VIEW COMPARISON:  None. FINDINGS: There is no evidence of fracture, dislocation, or joint effusion. There is no evidence of arthropathy or other focal bone abnormality. Soft tissues are unremarkable. IMPRESSION: No acute osseous abnormality identified. Electronically Signed   By: Jannifer Hickelaney  Williams M.D.   On: 09/25/2021 15:05   CT Head Wo Contrast  Result Date: 09/25/2021 CLINICAL DATA:  Status post fall from 20 feet. EXAM: CT HEAD WITHOUT CONTRAST CT CERVICAL SPINE WITHOUT CONTRAST TECHNIQUE: Multidetector CT imaging of the head and cervical spine was performed following the standard protocol without intravenous contrast. Multiplanar CT image reconstructions of the cervical spine were also generated. RADIATION DOSE REDUCTION: This exam was performed according to the departmental dose-optimization program which includes automated exposure  control, adjustment of the mA and/or kV according to patient size and/or use of iterative reconstruction technique. COMPARISON:  None. FINDINGS: CT HEAD FINDINGS Brain: No evidence of acute infarction, hemorrhage, hydrocephalus, extra-axial collection or mass lesion/mass effect. Vascular: No hyperdense vessel or unexpected calcification. Skull: No osseous abnormality. Sinuses/Orbits: Mucosal thickening of bilateral maxillary sinuses and ethmoid sinuses. Visualized mastoid sinuses are clear. Visualized orbits demonstrate no focal abnormality. Other: None CT CERVICAL SPINE FINDINGS Alignment: Normal. Skull base and vertebrae: No acute fracture. No primary bone lesion or focal pathologic process. Soft tissues and spinal canal: No prevertebral fluid or swelling. No visible canal hematoma. Disc levels: Degenerative disease with disc height loss at C4-5, C5-6 and C6-7 with bilateral uncovertebral degenerative changes and foraminal  narrowing. Upper chest: Lung apices are clear. Other: No fluid collection or hematoma. IMPRESSION: 1. No acute intracranial pathology. 2.  No acute osseous injury of the cervical spine. Electronically Signed   By: Elige Ko M.D.   On: 09/25/2021 15:41   CT Cervical Spine Wo Contrast  Result Date: 09/25/2021 CLINICAL DATA:  Status post fall from 20 feet. EXAM: CT HEAD WITHOUT CONTRAST CT CERVICAL SPINE WITHOUT CONTRAST TECHNIQUE: Multidetector CT imaging of the head and cervical spine was performed following the standard protocol without intravenous contrast. Multiplanar CT image reconstructions of the cervical spine were also generated. RADIATION DOSE REDUCTION: This exam was performed according to the departmental dose-optimization program which includes automated exposure control, adjustment of the mA and/or kV according to patient size and/or use of iterative reconstruction technique. COMPARISON:  None. FINDINGS: CT HEAD FINDINGS Brain: No evidence of acute infarction, hemorrhage, hydrocephalus, extra-axial collection or mass lesion/mass effect. Vascular: No hyperdense vessel or unexpected calcification. Skull: No osseous abnormality. Sinuses/Orbits: Mucosal thickening of bilateral maxillary sinuses and ethmoid sinuses. Visualized mastoid sinuses are clear. Visualized orbits demonstrate no focal abnormality. Other: None CT CERVICAL SPINE FINDINGS Alignment: Normal. Skull base and vertebrae: No acute fracture. No primary bone lesion or focal pathologic process. Soft tissues and spinal canal: No prevertebral fluid or swelling. No visible canal hematoma. Disc levels: Degenerative disease with disc height loss at C4-5, C5-6 and C6-7 with bilateral uncovertebral degenerative changes and foraminal narrowing. Upper chest: Lung apices are clear. Other: No fluid collection or hematoma. IMPRESSION: 1. No acute intracranial pathology. 2.  No acute osseous injury of the cervical spine. Electronically Signed   By:  Elige Ko M.D.   On: 09/25/2021 15:41   CT CHEST ABDOMEN PELVIS W CONTRAST  Result Date: 09/25/2021 CLINICAL DATA:  Abdominal trauma, blunt abdominal trauma in a 51 year old male. Fall 20 feet by report. EXAM: CT CHEST, ABDOMEN, AND PELVIS WITH CONTRAST TECHNIQUE: Multidetector CT imaging of the chest, abdomen and pelvis was performed following the standard protocol during bolus administration of intravenous contrast. RADIATION DOSE REDUCTION: This exam was performed according to the departmental dose-optimization program which includes automated exposure control, adjustment of the mA and/or kV according to patient size and/or use of iterative reconstruction technique. CONTRAST:  OMNIPAQUE IOHEXOL 350 MG/ML SOLN COMPARISON:  None. FINDINGS: CT CHEST FINDINGS Cardiovascular: Heart size is normal without pericardial effusion. Aortic caliber is normal. Contour of the thoracic aorta is smooth. No periaortic stranding. Central pulmonary vasculature unremarkable on venous phase. Mediastinum/Nodes: Thoracic inlet structures are normal. No mediastinal hematoma. No adenopathy in the chest. Lungs/Pleura: Tiny LEFT anterior and medial pneumothorax. No consolidation or evidence of pleural effusion. Airways are patent. Minimal volume loss at the lung bases Musculoskeletal: See below for full musculoskeletal  details. No substantial body wall contusion. CT ABDOMEN PELVIS FINDINGS Hepatobiliary: Liver with smooth contours. No signs of hepatic trauma or focal, suspicious hepatic lesion. Portal vein is patent. No pericholecystic stranding or signs of biliary duct distension. Pancreas: Normal, without mass, inflammation or ductal dilatation. Spleen: Spleen with smooth contours aside from small cleft in the cephalad spleen. No perisplenic stranding or signs of splenic laceration. No perisplenic fluid. Adrenals/Urinary Tract: Adrenal glands are unremarkable. Symmetric renal enhancement. No sign of hydronephrosis. No  suspicious renal lesion or perinephric stranding. Urinary bladder is grossly unremarkable. Stomach/Bowel: No acute gastrointestinal process. The appendix is normal Vascular/Lymphatic: Aorta with smooth contours. No stranding adjacent to the aorta. No adenopathy in the abdomen. No adenopathy in the pelvis Reproductive: Unremarkable by CT. Other: No free intraperitoneal air. No free intra-abdominal fluid or hemoperitoneum. Musculoskeletal: LEFT-sided rib fractures involving ribs 7, 8 and 9 posteriorly without substantial displacement. LEFT seventh rib fracture is segmental with a lateral fracture also noted. No signs of costochondral injury. Sternum is intact. No displaced fractures of RIGHT-sided ribs. Visualized clavicles and scapulae are intact. No fracture of the bony pelvis Spinal degenerative changes.  Sternum is intact. IMPRESSION: 1. Tiny LEFT anterior and medial pneumothorax. 2. LEFT-sided rib fractures involving ribs 7, 8 and 9 posteriorly without substantial displacement. LEFT seventh rib fracture is segmental as described. 3. No signs of acute traumatic injury to the abdomen or pelvis. These results were called by telephone at the time of interpretation on 09/25/2021 at 5:29 pm to provider Dr. Wilkie AyeHorton, Who verbally acknowledged these results. Electronically Signed   By: Donzetta KohutGeoffrey  Wile M.D.   On: 09/25/2021 17:30   DG Chest Portable 1 View  Result Date: 09/25/2021 CLINICAL DATA:  Trauma, chest pain EXAM: PORTABLE CHEST 1 VIEW COMPARISON:  None. FINDINGS: Heart size and mediastinal contours are within normal limits. No suspicious pulmonary opacities identified. No pleural effusion or pneumothorax visualized. No acute osseous abnormality appreciated. IMPRESSION: No acute intrathoracic process identified. Electronically Signed   By: Jannifer Hickelaney  Williams M.D.   On: 09/25/2021 15:06   DG Shoulder Left  Result Date: 09/25/2021 CLINICAL DATA:  Left shoulder injury EXAM: LEFT SHOULDER - 2+ VIEW COMPARISON:   None. FINDINGS: There is no evidence of fracture or dislocation. There is no evidence of arthropathy or other focal bone abnormality. Soft tissues are unremarkable. IMPRESSION: No acute osseous abnormality identified. Electronically Signed   By: Jannifer Hickelaney  Williams M.D.   On: 09/25/2021 15:02   DG Knee Right Port  Result Date: 09/25/2021 CLINICAL DATA:  Right knee injury. EXAM: PORTABLE RIGHT KNEE - 1-2 VIEW COMPARISON:  None. FINDINGS: No acute fracture or dislocation identified. Joint spaces are preserved. No large joint effusion. IMPRESSION: No acute osseous abnormality identified. Electronically Signed   By: Jannifer Hickelaney  Williams M.D.   On: 09/25/2021 15:02    Pending Labs Unresulted Labs (From admission, onward)     Start     Ordered   09/26/21 0500  CBC  Tomorrow morning,   R        09/25/21 1829   09/26/21 0500  Basic metabolic panel  Tomorrow morning,   R        09/25/21 1829            Vitals/Pain Today's Vitals   09/25/21 2100 09/26/21 0035 09/26/21 0111 09/26/21 0203  BP: 124/86  122/78   Pulse: 84  88   Resp: 19  20   Temp:   97.9 F (36.6 C)   TempSrc:  Oral   SpO2: 96%  98%   Weight:      Height:      PainSc:  6   4     Isolation Precautions No active isolations  Medications Medications  enoxaparin (LOVENOX) injection 30 mg (has no administration in time range)  metoprolol tartrate (LOPRESSOR) injection 5 mg (has no administration in time range)  hydrALAZINE (APRESOLINE) injection 10 mg (has no administration in time range)  ondansetron (ZOFRAN-ODT) disintegrating tablet 4 mg (has no administration in time range)    Or  ondansetron (ZOFRAN) injection 4 mg (has no administration in time range)  docusate sodium (COLACE) capsule 100 mg (100 mg Oral Not Given 09/25/21 2143)  acetaminophen (TYLENOL) tablet 650 mg (650 mg Oral Given 09/26/21 0037)  gabapentin (NEURONTIN) capsule 300 mg (300 mg Oral Given 09/25/21 2141)  HYDROmorphone (DILAUDID) injection 0.5 mg (has  no administration in time range)  ibuprofen (ADVIL) tablet 800 mg (800 mg Oral Given 09/25/21 2141)  methocarbamol (ROBAXIN) tablet 500 mg (has no administration in time range)  oxyCODONE (Oxy IR/ROXICODONE) immediate release tablet 5 mg (has no administration in time range)  morphine 4 MG/ML injection 4 mg (4 mg Intravenous Given 09/25/21 1457)  iohexol (OMNIPAQUE) 350 MG/ML injection 100 mL (100 mLs Intravenous Contrast Given 09/25/21 1656)  HYDROmorphone (DILAUDID) injection 1 mg (1 mg Intravenous Given 09/25/21 1845)    Mobility walks Moderate fall risk   Focused Assessments Cardiac Assessment Handoff:  Cardiac Rhythm: Sinus tachycardia No results found for: CKTOTAL, CKMB, CKMBINDEX, TROPONINI No results found for: DDIMER Does the Patient currently have chest pain? No    R Recommendations: See Admitting Provider Note  Report given to:   Additional Notes: na

## 2021-09-26 NOTE — TOC Initial Note (Signed)
Transition of Care [facility]) - Initial/Assessment Note    Patient Details  Name: Vincent Mccarthy MRN: [id number] Date of Birth: 09-11-1970  [facility] CM/SW Contact:    Glennon Mac, RN Phone Number: 09/26/2021, 3:29 PM  Clinical Narrative:                 Pt is a 51 y/o male admitted following fall off of roof. Found to have L 7-9 rib fractures with small pneumothorax. Pt also with R knee and L shoulder pain, however, X-ray negative. PTA, pt independent and living at home with spouse and children.  PT/OT recommending OP follow up and DME for home.  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  Spoke with Ledon Snare, claims examiner for [facility]: she asks that I fax orders to her at [phone].  Faxed orders for DME and OP therapies to her as requested.   WC Case Manager has been assigned to patient; Sherri Layne with [employer] to follow for patient needs.  Phone: [phone]; fax: [phone].  Also faxed DME and OP therapy orders to [facility] to expedite patient needs for home.    Expected Discharge Plan: OP Rehab Barriers to Discharge: Continued Medical Work up   Patient Goals and CMS Choice Patient states their goals for this hospitalization and ongoing recovery are:: to go home      Expected Discharge Plan and Services Expected Discharge Plan: OP Rehab       Living arrangements for the past 2 months: Single Family Home                 DME Arranged: 3-N-1, Walker rolling DME Agency: [employer] Date DME Agency Contacted: 09/26/21 Time DME Agency Contacted: [phone] Representative spoke with at DME Agency: Velna Hatchet            Prior Living Arrangements/Services Living arrangements for the past 2 months: Single Family Home Lives with:: Minor Children, Spouse Patient language and need for interpreter reviewed:: Yes        Need for Family Participation in Patient Care: Yes (Comment) Care giver  support system in place?: Yes (comment)   Criminal Activity/Legal Involvement Pertinent to Current Situation/Hospitalization: No - Comment as needed  Activities of Daily Living Home Assistive Devices/Equipment: None ADL Screening (condition at time of admission) Patient's cognitive ability adequate to safely complete daily activities?: No Is the patient deaf or have difficulty hearing?: No Does the patient have difficulty seeing, even when wearing glasses/contacts?: No Does the patient have difficulty concentrating, remembering, or making decisions?: No Patient able to express need for assistance with ADLs?: No Does the patient have difficulty dressing or bathing?: No Independently performs ADLs?: Yes (appropriate for developmental age) Does the patient have difficulty walking or climbing stairs?: No Weakness of Legs: Right Weakness of Arms/Hands: None  Permission Sought/Granted                  Emotional Assessment   Attitude/Demeanor/Rapport: Engaged Affect (typically observed): Accepting Orientation: : Oriented to Self, Oriented to Place, Oriented to  Time, Oriented to Situation      Admission diagnosis:  Rib fractures [S22.49XA] Trauma [T14.90XA] Knee pain [M25.569] Patient Active Problem List   Diagnosis Date Noted   Rib fractures 09/25/2021   PCP:  Oneita Hurt, No Pharmacy:   [facility] [address], [place] - [address] [place] [address] Phone: [phone] Fax: [phone]  Social Determinants of Health (SDOH) Interventions    Readmission Risk Interventions No flowsheet data found.  Quintella Baton, RN, BSN  Trauma/Neuro ICU Case Manager [phone]

## 2021-09-26 NOTE — Plan of Care (Signed)
  Problem: Activity: Goal: Risk for activity intolerance will decrease Outcome: Progressing   Problem: Nutrition: Goal: Adequate nutrition will be maintained Outcome: Progressing   Problem: Coping: Goal: Level of anxiety will decrease Outcome: Progressing   

## 2021-09-27 ENCOUNTER — Other Ambulatory Visit (HOSPITAL_COMMUNITY): Payer: Self-pay

## 2021-09-27 MED ORDER — LIDOCAINE 5 % EX PTCH
1.0000 | MEDICATED_PATCH | CUTANEOUS | Status: DC
  Administered 2021-09-27: 1 via TRANSDERMAL
  Filled 2021-09-27: qty 1

## 2021-09-27 MED ORDER — OXYCODONE HCL 5 MG PO TABS: 5.0000 mg | ORAL_TABLET | Freq: Four times a day (QID) | ORAL | 0 refills | Status: DC | PRN

## 2021-09-27 MED ORDER — OXYCODONE HCL 5 MG PO TABS
5.0000 mg | ORAL_TABLET | Freq: Four times a day (QID) | ORAL | 0 refills | Status: DC | PRN
  Filled 2021-09-27: qty 28, 7d supply, fill #0

## 2021-09-27 MED ORDER — IBUPROFEN 800 MG PO TABS: 800.0000 mg | ORAL_TABLET | Freq: Three times a day (TID) | ORAL | 0 refills | Status: DC | PRN

## 2021-09-27 MED ORDER — DOCUSATE SODIUM 100 MG PO CAPS
100.0000 mg | ORAL_CAPSULE | Freq: Two times a day (BID) | ORAL | 0 refills | Status: DC
  Filled 2021-09-27: qty 10, 5d supply, fill #0

## 2021-09-27 MED ORDER — IBUPROFEN 800 MG PO TABS
800.0000 mg | ORAL_TABLET | Freq: Three times a day (TID) | ORAL | 0 refills | Status: DC | PRN
  Filled 2021-09-27: qty 30, 10d supply, fill #0

## 2021-09-27 MED ORDER — LIDOCAINE 5 % EX PTCH: 1.0000 | MEDICATED_PATCH | CUTANEOUS | 0 refills | Status: AC

## 2021-09-27 MED ORDER — METHOCARBAMOL 500 MG PO TABS: 500.0000 mg | ORAL_TABLET | Freq: Three times a day (TID) | ORAL | 0 refills | Status: AC | PRN

## 2021-09-27 MED ORDER — METHOCARBAMOL 500 MG PO TABS
500.0000 mg | ORAL_TABLET | Freq: Three times a day (TID) | ORAL | 0 refills | Status: DC | PRN
  Filled 2021-09-27: qty 30, 10d supply, fill #0

## 2021-09-27 MED ORDER — DOCUSATE SODIUM 100 MG PO CAPS: 100.0000 mg | ORAL_CAPSULE | Freq: Two times a day (BID) | ORAL | 0 refills | Status: AC

## 2021-09-27 MED ORDER — LIDOCAINE 5 % EX PTCH
1.0000 | MEDICATED_PATCH | CUTANEOUS | 0 refills | Status: DC
  Filled 2021-09-27: qty 14, 14d supply, fill #0

## 2021-09-27 NOTE — Progress Notes (Signed)
MRI L shoulder and R knee CT reviewed. Large L rotator cuff tear would benefit from surgery. Discussed with Dr. Griffin Basil who can see him and fix both R knee and L shoulder in a staged fashion. If patient safe to go home can be done as outpatient with follow up to see Dr. Griffin Basil in clinic early next week.

## 2021-09-27 NOTE — H&P (View-Only) (Signed)
Patient seen at the request of Dr. Zachery Dakins.  In short patient had a fall off a roof and was noted to have a left multi ligamentous knee injury involving the ACL PCL MCL and a posterior lateral tibial plateau fracture.  He additionally had medial lateral meniscus tears and some injury to his patellar tendon.  The patient additionally was noted to have a traumatic cuff tear involving the left shoulder.  Has been mobilizing with therapy using a walker.  He has been maintaining his nonweightbearing status on his knee.  Physical exam: Left shoulder has extremely limited range of motion actively passively were able to get 14-150 degrees.  Distal motor and sensory function intact.  Cuff is weak with supra and infraspinatus testing.  Limited exam further secondary to patient pain  Right knee: Limited exam secondary to patient pain.  Distal motor and sensory function intact.  Patient has gross instability with a valgus stress across the knee.   Imaging: MRI of the shoulder demonstrates a full-thickness supraspinatus tear with retraction past the glenoid, no atrophy, concern for traumatic cuff tear.  MRI and CT of the knee were reviewed which demonstrate a 3 ligament knee injury with possible injury to the posterior lateral corner as well.  The patellar tendon is also injured.  Medial lateral meniscus tear is noted.  Posterior lateral tibial plateau fracture noted as well.   Assessment: Traumatic injury while at work resulting in [place] knee injury on the right and left traumatic cuff tear  We had a long discussion with the patient and his family regarding his options.  Operating on the knee and shoulder in the same setting is essentially impossible.  This is due to patient positioning issues as well as operative time.  Additionally doing both extremities would likely leave the patient bedridden for 6 to 8 weeks.  As the patient had no previous injury to the shoulder we feel it is likely in his best interest  to consider operative intervention of the knee allowing him to keep his nonweightbearing status for 6 weeks and then appropriately address his shoulder.  As this is a traumatic injury his outcomes in the first few months should be reasonable if we leave the shoulder for a later date.  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  This is a very complex surgery and will take at least 3-1/2 hours.  Patient's risks include but are not limited to infection, stiffness, loosening and the need for revision surgery.  The plan would be after 6 to 8 weeks from the surgery proceeding with his rotator cuff repair.  For now he will be weightbearing for mobility in regards to the shoulder to tolerance.  No sling necessary.  He should be touchdown weightbearing regards to the right lower extremity in his Bledsoe locked straight.  All questions answered.

## 2021-09-27 NOTE — Progress Notes (Signed)
Orthopedic Tech Progress Note Patient Details:  Vincent Mccarthy Jun 15, 1971 [id number]  Ortho Devices Type of Ortho Device: Arm sling Ortho Device/Splint Location: LUE Ortho Device/Splint Interventions: Ordered, Application   Post Interventions Patient Tolerated: Well  [name] A [name] 09/27/2021, 11:42 AM

## 2021-09-27 NOTE — TOC Progression Note (Addendum)
°  Transition of Care [facility]) - Progression Note    Patient Details  Name: Vincent Mccarthy MRN: [id number] Date of Birth: [birth year]  Transition of Care [facility] CM/SW Contact  Ella Bodo, RN Phone Number: 09/27/2021, 10:03 AM  Clinical Narrative:    Spoke with Delmer Islam at [place] agency: orders for DME and OP therapies have been received; DME to be delivered to patient's home today between 12 and 4pm.  Will fax clinical information per request to Loel Ro, [facility] Case Manager; fax # [phone].   [id number] Faxed order for tub bench, discharge summary and ortho consult note to [facility] Case Manager.  DC Rx originally sent to [place], but they do not accept  [facility].  Rx resent to patient's pharmacy, [facility] [place]; phone # [phone].  [facility] Case Manager to f/u with pharmacy regarding medication payment coverage.     Expected Discharge Plan: OP Rehab Barriers to Discharge: Continued Medical Work up  Expected Discharge Plan and Services Expected Discharge Plan: OP Rehab       Living arrangements for the past 2 months: Single Family Home                 DME Arranged: 3-N-1, Walker rolling DME Agency: [employer] Date DME Agency Contacted: 09/26/21 Time DME Agency Contacted: 1427 Representative spoke with at DME Agency: [place] ([place]) Interventions    Readmission Risk Interventions No flowsheet data found.  Reinaldo Raddle, RN, BSN  Trauma/Neuro ICU Case Manager [phone]

## 2021-09-27 NOTE — Consult Note (Signed)
Patient seen at the request of Dr. Zachery Dakins.  In short patient had a fall off a roof and was noted to have a left multi ligamentous knee injury involving the ACL PCL MCL and a posterior lateral tibial plateau fracture.  He additionally had medial lateral meniscus tears and some injury to his patellar tendon.  The patient additionally was noted to have a traumatic cuff tear involving the left shoulder.  Has been mobilizing with therapy using a walker.  He has been maintaining his nonweightbearing status on his knee.  Physical exam: Left shoulder has extremely limited range of motion actively passively were able to get 14-150 degrees.  Distal motor and sensory function intact.  Cuff is weak with supra and infraspinatus testing.  Limited exam further secondary to patient pain  Right knee: Limited exam secondary to patient pain.  Distal motor and sensory function intact.  Patient has gross instability with a valgus stress across the knee.   Imaging: MRI of the shoulder demonstrates a full-thickness supraspinatus tear with retraction past the glenoid, no atrophy, concern for traumatic cuff tear.  MRI and CT of the knee were reviewed which demonstrate a 3 ligament knee injury with possible injury to the posterior lateral corner as well.  The patellar tendon is also injured.  Medial lateral meniscus tear is noted.  Posterior lateral tibial plateau fracture noted as well.   Assessment: Traumatic injury while at work resulting in [place] knee injury on the right and left traumatic cuff tear  We had a long discussion with the patient and his family regarding his options.  Operating on the knee and shoulder in the same setting is essentially impossible.  This is due to patient positioning issues as well as operative time.  Additionally doing both extremities would likely leave the patient bedridden for 6 to 8 weeks.  As the patient had no previous injury to the shoulder we feel it is likely in his best interest  to consider operative intervention of the knee allowing him to keep his nonweightbearing status for 6 weeks and then appropriately address his shoulder.  As this is a traumatic injury his outcomes in the first few months should be reasonable if we leave the shoulder for a later date.  If patient is mobilizing safely and has no objections from the remainder of his team would be appropriate for discharge with return to [facility] for a outpatient surgery performing a allograft ACL, MCL, PCL reconstruction with possible open reduction internal fixation of the lateral tibial plateau, possible posterior lateral corner reconstruction and possible repair of the patellar tendon with medial and lateral meniscectomy versus repair.  This is a very complex surgery and will take at least 3-1/2 hours.  Patient's risks include but are not limited to infection, stiffness, loosening and the need for revision surgery.  The plan would be after 6 to 8 weeks from the surgery proceeding with his rotator cuff repair.  For now he will be weightbearing for mobility in regards to the shoulder to tolerance.  No sling necessary.  He should be touchdown weightbearing regards to the right lower extremity in his Bledsoe locked straight.  All questions answered.

## 2021-09-27 NOTE — Progress Notes (Signed)
Occupational Therapy Treatment Patient Details Name: Vincent Mccarthy MRN: [id number] DOB: [birth year] Today's Date: 09/27/2021   History of present illness Pt is a 51 y/o male admitted following fall off of roof. Found to have L 7-9 rib fractures with small pneumothorax. Pt also with R knee and L shoulder pain, however, X-ray negative. MRI revealed Significant complex knee injury including depressed lateral  tibial plateau fracture, ACL, PCL and MCL ruptures, medial and  lateral meniscus tears, arcuate ligament rupture and partial tearing  of the patellar tendon. MRI of L shoulder showed Complete tear of the supraspinatus tendon with 4 cm of  retraction.   OT comments  Educated pt in compensatory strategies for ADLs and use of 3 in 1 and tub transfer bench in light of MRI findings and updated NWB status of L LE. Instructed in use of sling for comfort and for pt to avoid overhead use and exercises for L shoulder until he sees orthopedist. Pt and family verbalizing and/or demonstrating understanding.    Recommendations for follow up therapy are one component of a multi-disciplinary discharge planning process, led by the attending physician.  Recommendations may be updated based on patient status, additional functional criteria and insurance authorization.    Follow Up Recommendations  Follow physician's recommendations for discharge plan and follow up therapies    Assistance Recommended at Discharge Intermittent Supervision/Assistance  Patient can return home with the following  A little help with walking and/or transfers;A little help with bathing/dressing/bathroom;Assistance with cooking/housework   Equipment Recommendations  BSC/3in1;Tub/shower bench    Recommendations for Other Services      Precautions / Restrictions Precautions Precautions: Fall Required Braces or Orthoses: Other Brace;Sling Other Brace: bledsoe brace locked in extension, L UE sling for  comfort Restrictions Weight Bearing Restrictions: Yes LUE Weight Bearing: Weight bearing as tolerated RLE Weight Bearing: Non weight bearing Other Position/Activity Restrictions: No ROM at R knee       Mobility Bed Mobility Overal bed mobility: Modified Independent                  Transfers Overall transfer level: Needs assistance Equipment used: Rolling walker (2 wheels) Transfers: Sit to/from Stand Sit to Stand: Supervision           General transfer comment: Supervision for safety. Able to maintain precautions.     Balance Overall balance assessment: Needs assistance Sitting-balance support: No upper extremity supported Sitting balance-Leahy Scale: Good     Standing balance support: Bilateral upper extremity supported Standing balance-Leahy Scale: Poor Standing balance comment: Reliant on BUE support                           ADL either performed or assessed with clinical judgement   ADL Overall ADL's : Needs assistance/impaired           Upper Body Bathing Details (indicate cue type and reason): educated pt in compensatory strategies Lower Body Bathing: Minimal assistance;Sitting/lateral leans Lower Body Bathing Details (indicate cue type and reason): instructed to perform in sitting with lateral leans Upper Body Dressing : Set up;Sitting Upper Body Dressing Details (indicate cue type and reason): instructed in compensatory strategies for donning and doffing shirt and sling use Lower Body Dressing: Minimal assistance;Sit to/from stand Lower Body Dressing Details (indicate cue type and reason): Instructed in compensatory strategy in sitting, leaning side to side. Toilet Transfer: Min guard;Ambulation;Rolling walker (2 wheels)           Functional mobility  during ADLs: Min guard;Rolling walker (2 wheels) General ADL Comments: Educated in use of 3 in 1 over toilet and in use of tub transfer bench.    Extremity/Trunk Assessment               Vision       Perception     Praxis      Cognition Arousal/Alertness: Awake/alert Behavior During Therapy: WFL for tasks assessed/performed Overall Cognitive Status: Within Functional Limits for tasks assessed                                          Exercises      Shoulder Instructions       General Comments      Pertinent Vitals/ Pain       Pain Assessment Pain Assessment: Faces Faces Pain Scale: Hurts a little bit Pain Location: L ribs Pain Descriptors / Indicators: Guarding, Grimacing Pain Intervention(s): Monitored during session  Home Living                                          Prior Functioning/Environment              Frequency  Min 2X/week        Progress Toward Goals  OT Goals(current goals can now be found in the care plan section)  Progress towards OT goals: Progressing toward goals  Acute Rehab OT Goals OT Goal Formulation: With patient Time For Goal Achievement: 10/10/21 Potential to Achieve Goals: Good  Plan Discharge plan needs to be updated    Co-evaluation                 AM-PAC OT "6 Clicks" Daily Activity     Outcome Measure   Help from another person eating meals?: None Help from another person taking care of personal grooming?: A Little Help from another person toileting, which includes using toliet, bedpan, or urinal?: A Little Help from another person bathing (including washing, rinsing, drying)?: A Little Help from another person to put on and taking off regular upper body clothing?: A Little Help from another person to put on and taking off regular lower body clothing?: A Little 6 Click Score: 19    End of Session Equipment Utilized During Treatment: Gait belt;Rolling walker (2 wheels);Right knee immobilizer  OT Visit Diagnosis: Other abnormalities of gait and mobility (R26.89);Muscle weakness (generalized) (M62.81);Pain   Activity Tolerance Patient  tolerated treatment well   Patient Left in bed;with call bell/phone within reach;with family/visitor present   Nurse Communication          Time: 9518-8416 OT Time Calculation (min): 18 min  Charges: OT General Charges $OT Visit: 1 Visit OT Treatments $Self Care/Home Management : 8-22 mins  Martie Round, OTR/L Acute Rehabilitation Services Pager: [phone] Office: [phone]   Evern Bio 09/27/2021, 12:39 PM

## 2021-09-27 NOTE — Progress Notes (Signed)
Physical Therapy Treatment Patient Details Name: Vincent Mccarthy MRN: [id number] DOB: 1971-02-28 Today's Date: 09/27/2021   History of Present Illness Pt is a 51 y/o male admitted following fall off of roof. Found to have L 7-9 rib fractures with small pneumothorax. Pt also with R knee and L shoulder pain, however, X-ray negative. MRI revealed Significant complex knee injury including depressed lateral  tibial plateau fracture, ACL, PCL and MCL ruptures, medial and  lateral meniscus tears, arcuate ligament rupture and partial tearing  of the patellar tendon. MRI of L shoulder showed Complete tear of the supraspinatus tendon with 4 cm of  retraction.    PT Comments    Pt progressing towards goals. Reviewed updated weightbearing status with pt and pt able to maintain with use of RW. Supervision for safety when using RW. Reviewed brace usage and ROM restrictions. Pt reports he didn't feel he needed to practice his single step, however, reviewed technique and ensure pt was able to clear LLE using RW. Current recommendations appropriate. However, depending on surgical needs later on, may need additional DME. Discussed with family. Will continue to follow acutely.      Recommendations for follow up therapy are one component of a multi-disciplinary discharge planning process, led by the attending physician.  Recommendations may be updated based on patient status, additional functional criteria and insurance authorization.  Follow Up Recommendations  Outpatient PT (once cleared by ortho MD)     Assistance Recommended at Discharge Intermittent Supervision/Assistance  Patient can return home with the following Help with stairs or ramp for entrance   Equipment Recommendations  Rolling walker (2 wheels);BSC/3in1    Recommendations for Other Services       Precautions / Restrictions Precautions Precautions: Fall Required Braces or Orthoses: Other Brace Other Brace: bledsoe brace locked in  extension Restrictions Weight Bearing Restrictions: Yes LUE Weight Bearing: Weight bearing as tolerated (sling for comfort) RLE Weight Bearing: Non weight bearing Other Position/Activity Restrictions: No ROM at R knee     Mobility  Bed Mobility Overal bed mobility: Modified Independent                  Transfers Overall transfer level: Needs assistance Equipment used: Rolling walker (2 wheels) Transfers: Sit to/from Stand Sit to Stand: Supervision           General transfer comment: Supervision for safety. Able to maintain precautions.    Ambulation/Gait Ambulation/Gait assistance: Min guard Gait Distance (Feet): 40 Feet Assistive device: Rolling walker (2 wheels) Gait Pattern/deviations: Step-to pattern Gait velocity: Decreased     General Gait Details: Hop to gait pattern. Able to maintain NWB appropriately using RW.   Stairs Stairs: Yes       General stair comments: Verbally reviewed stair navigation, however, pt reports he didn't feel he needed to practice. PRacticed clearing LLE using RW and pt able to get good clearance from floor using RW.   Wheelchair Mobility    Modified Rankin (Stroke Patients Only)       Balance Overall balance assessment: Needs assistance Sitting-balance support: No upper extremity supported Sitting balance-Leahy Scale: Good     Standing balance support: Bilateral upper extremity supported Standing balance-Leahy Scale: Poor Standing balance comment: Reliant on BUE support                            Cognition Arousal/Alertness: Awake/alert Behavior During Therapy: WFL for tasks assessed/performed Overall Cognitive Status: Within Functional Limits for tasks assessed  Exercises Other Exercises Other Exercises: Educated about performing ankle pumps on RLE to help with blood flow    General Comments        Pertinent Vitals/Pain Pain  Assessment Pain Assessment: Faces Faces Pain Scale: Hurts a little bit Pain Location: L shoulder, L ribs, R knee Pain Descriptors / Indicators: Guarding, Grimacing    Home Living                          Prior Function            PT Goals (current goals can now be found in the care plan section) Acute Rehab PT Goals Patient Stated Goal: to go home PT Goal Formulation: With patient Time For Goal Achievement: 10/10/21 Potential to Achieve Goals: Good Progress towards PT goals: Progressing toward goals    Frequency    Min 3X/week      PT Plan Current plan remains appropriate    Co-evaluation              AM-PAC PT "6 Clicks" Mobility   Outcome Measure  Help needed turning from your back to your side while in a flat bed without using bedrails?: None Help needed moving from lying on your back to sitting on the side of a flat bed without using bedrails?: None Help needed moving to and from a bed to a chair (including a wheelchair)?: None Help needed standing up from a chair using your arms (e.g., wheelchair or bedside chair)?: A Little Help needed to walk in hospital room?: A Little Help needed climbing 3-5 steps with a railing? : A Little 6 Click Score: 21    End of Session Equipment Utilized During Treatment: Gait belt Activity Tolerance: Patient tolerated treatment well Patient left: in bed;with call bell/phone within reach;with family/visitor present Nurse Communication: Mobility status PT Visit Diagnosis: Difficulty in walking, not elsewhere classified (R26.2);Pain Pain - Right/Left: Left Pain - part of body: Shoulder (ribs, R knee)     Time: [id number] PT Time Calculation (min) (ACUTE ONLY): 13 min  Charges:  $Gait Training: 8-22 mins                     Lou Miner, DPT  Acute Rehabilitation Services  Pager: [phone] Office: [phone]    Rudean Hitt 09/27/2021, 11:23 AM

## 2021-09-27 NOTE — Progress Notes (Signed)
Pt called nurse's station yelling "HELP!". When RN arrived, pt shaking violently and stating "I feel so cold". Temp 98.0 VSS- See fs. Pt warmed with warm blankets and heat packs. Trauma PA and Rapid Response RN notified. Episode lasted approximately 5 minutes. Pt admits to being emotional after speaking with father on facetime. Pain well controlled. He states that he's had episodes similar to this before, typically when he has a fever at home. Also admits to one similar episode last evening. He reports no alcohol use x 1 year. Pt currently stable, no longer shaking. Will continue to monitor. Instructed to call RN for any further episodes.

## 2021-09-28 ENCOUNTER — Other Ambulatory Visit: Payer: Self-pay

## 2021-09-28 ENCOUNTER — Encounter (HOSPITAL_COMMUNITY): Payer: Self-pay | Admitting: Orthopaedic Surgery

## 2021-09-28 NOTE — Progress Notes (Signed)
Spoke with pt's daughter, Erie Noe for pre-op call. DPR on file. Erie Noe speaks [place]. She states pt does not have a cardiac history, no HTN or Diabetes.   Spanish Interpreter has been arranged for Monday.   Will need Covid test day of surgery.

## 2021-09-30 ENCOUNTER — Encounter (HOSPITAL_COMMUNITY): Payer: Self-pay | Admitting: Orthopaedic Surgery

## 2021-10-01 ENCOUNTER — Ambulatory Visit (HOSPITAL_COMMUNITY)
Admission: RE | Admit: 2021-10-01 | Discharge: 2021-10-01 | Disposition: A | Discharge status: Home / Self Care | Payer: No Typology Code available for payment source | Attending: Orthopaedic Surgery | Admitting: Orthopaedic Surgery

## 2021-10-01 ENCOUNTER — Ambulatory Visit (HOSPITAL_COMMUNITY): Payer: No Typology Code available for payment source | Admitting: Anesthesiology

## 2021-10-01 ENCOUNTER — Ambulatory Visit (HOSPITAL_COMMUNITY): Payer: No Typology Code available for payment source

## 2021-10-01 ENCOUNTER — Encounter (HOSPITAL_COMMUNITY): Payer: Self-pay | Admitting: Orthopaedic Surgery

## 2021-10-01 ENCOUNTER — Other Ambulatory Visit: Payer: Self-pay

## 2021-10-01 ENCOUNTER — Encounter (HOSPITAL_COMMUNITY)
Admission: RE | Disposition: A | Discharge status: Home / Self Care | Payer: Self-pay | Source: Home / Self Care | Attending: Orthopaedic Surgery

## 2021-10-01 DIAGNOSIS — S82141A Displaced bicondylar fracture of right tibia, initial encounter for closed fracture: Secondary | ICD-10-CM | POA: Diagnosis not present

## 2021-10-01 DIAGNOSIS — S83281A Other tear of lateral meniscus, current injury, right knee, initial encounter: Secondary | ICD-10-CM | POA: Diagnosis present

## 2021-10-01 DIAGNOSIS — S46012A Strain of muscle(s) and tendon(s) of the rotator cuff of left shoulder, initial encounter: Secondary | ICD-10-CM | POA: Diagnosis not present

## 2021-10-01 DIAGNOSIS — W132XXA Fall from, out of or through roof, initial encounter: Secondary | ICD-10-CM | POA: Diagnosis not present

## 2021-10-01 DIAGNOSIS — S83521A Sprain of posterior cruciate ligament of right knee, initial encounter: Secondary | ICD-10-CM | POA: Insufficient documentation

## 2021-10-01 DIAGNOSIS — S83241A Other tear of medial meniscus, current injury, right knee, initial encounter: Secondary | ICD-10-CM | POA: Insufficient documentation

## 2021-10-01 DIAGNOSIS — S838X1A Sprain of other specified parts of right knee, initial encounter: Secondary | ICD-10-CM | POA: Insufficient documentation

## 2021-10-01 DIAGNOSIS — S83411A Sprain of medial collateral ligament of right knee, initial encounter: Secondary | ICD-10-CM | POA: Diagnosis not present

## 2021-10-01 DIAGNOSIS — Y939 Activity, unspecified: Secondary | ICD-10-CM | POA: Insufficient documentation

## 2021-10-01 DIAGNOSIS — Z20822 Contact with and (suspected) exposure to covid-19: Secondary | ICD-10-CM | POA: Diagnosis not present

## 2021-10-01 DIAGNOSIS — S76111A Strain of right quadriceps muscle, fascia and tendon, initial encounter: Secondary | ICD-10-CM | POA: Diagnosis not present

## 2021-10-01 DIAGNOSIS — S83511A Sprain of anterior cruciate ligament of right knee, initial encounter: Secondary | ICD-10-CM | POA: Insufficient documentation

## 2021-10-01 DIAGNOSIS — Z09 Encounter for follow-up examination after completed treatment for conditions other than malignant neoplasm: Secondary | ICD-10-CM

## 2021-10-01 HISTORY — PX: KNEE ARTHROSCOPY WITH MEDIAL MENISECTOMY: SHX5651

## 2021-10-01 HISTORY — PX: KNEE ARTHROSCOPY WITH LATERAL MENISECTOMY: SHX6193

## 2021-10-01 HISTORY — PX: ANTERIOR CRUCIATE LIGAMENT REPAIR: SHX115

## 2021-10-01 HISTORY — PX: MEDIAL COLLATERAL LIGAMENT REPAIR, KNEE: SHX2019

## 2021-10-01 HISTORY — PX: PATELLAR TENDON REPAIR: SHX737

## 2021-10-01 HISTORY — DX: Pneumonia, unspecified organism: J18.9

## 2021-10-01 HISTORY — DX: Pure hypercholesterolemia, unspecified: E78.00

## 2021-10-01 HISTORY — PX: KNEE ARTHROSCOPY WITH POSTERIOR CRUCIATE LIGAMENT (PCL) RECONSTRUCTION: SHX5657

## 2021-10-01 LAB — SARS CORONAVIRUS 2 BY RT PCR (HOSPITAL ORDER, PERFORMED IN ~~LOC~~ HOSPITAL LAB): SARS Coronavirus 2: NEGATIVE

## 2021-10-01 IMAGING — CR DG KNEE 1-2V PORT*R*
3 series · 3 of 3 positions shown · non-contrast
Comparison: [DATE] CT scan

CLINICAL DATA: Right knee surgery, postoperative assessment. The
patient fell from a roof at work.

EXAM:
PORTABLE RIGHT KNEE - 1-2 VIEW

[AP]
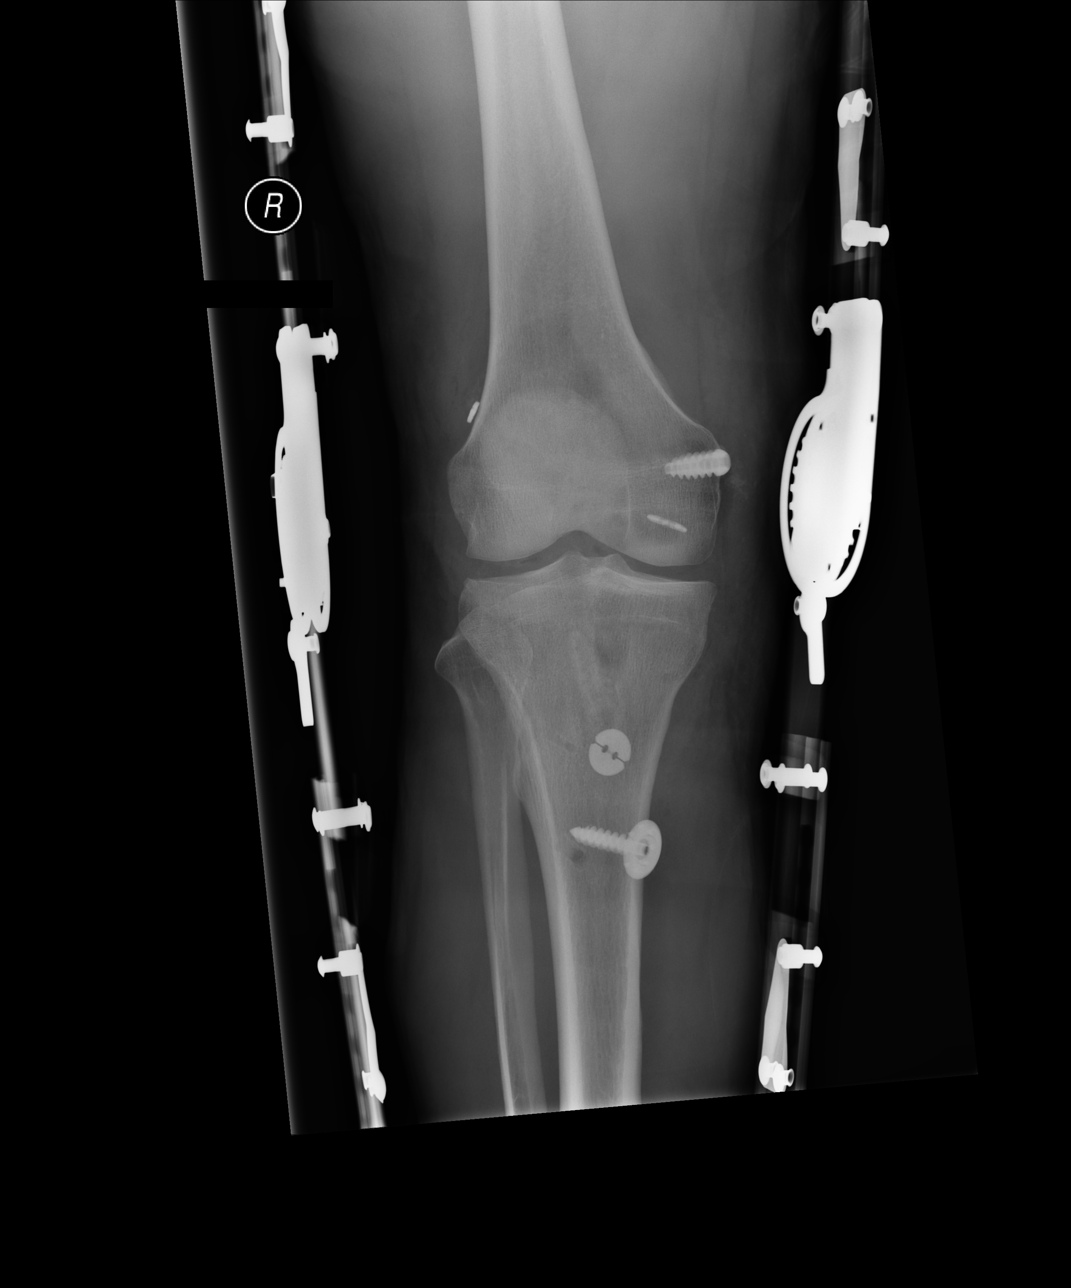

[lateral (1 of 2)]
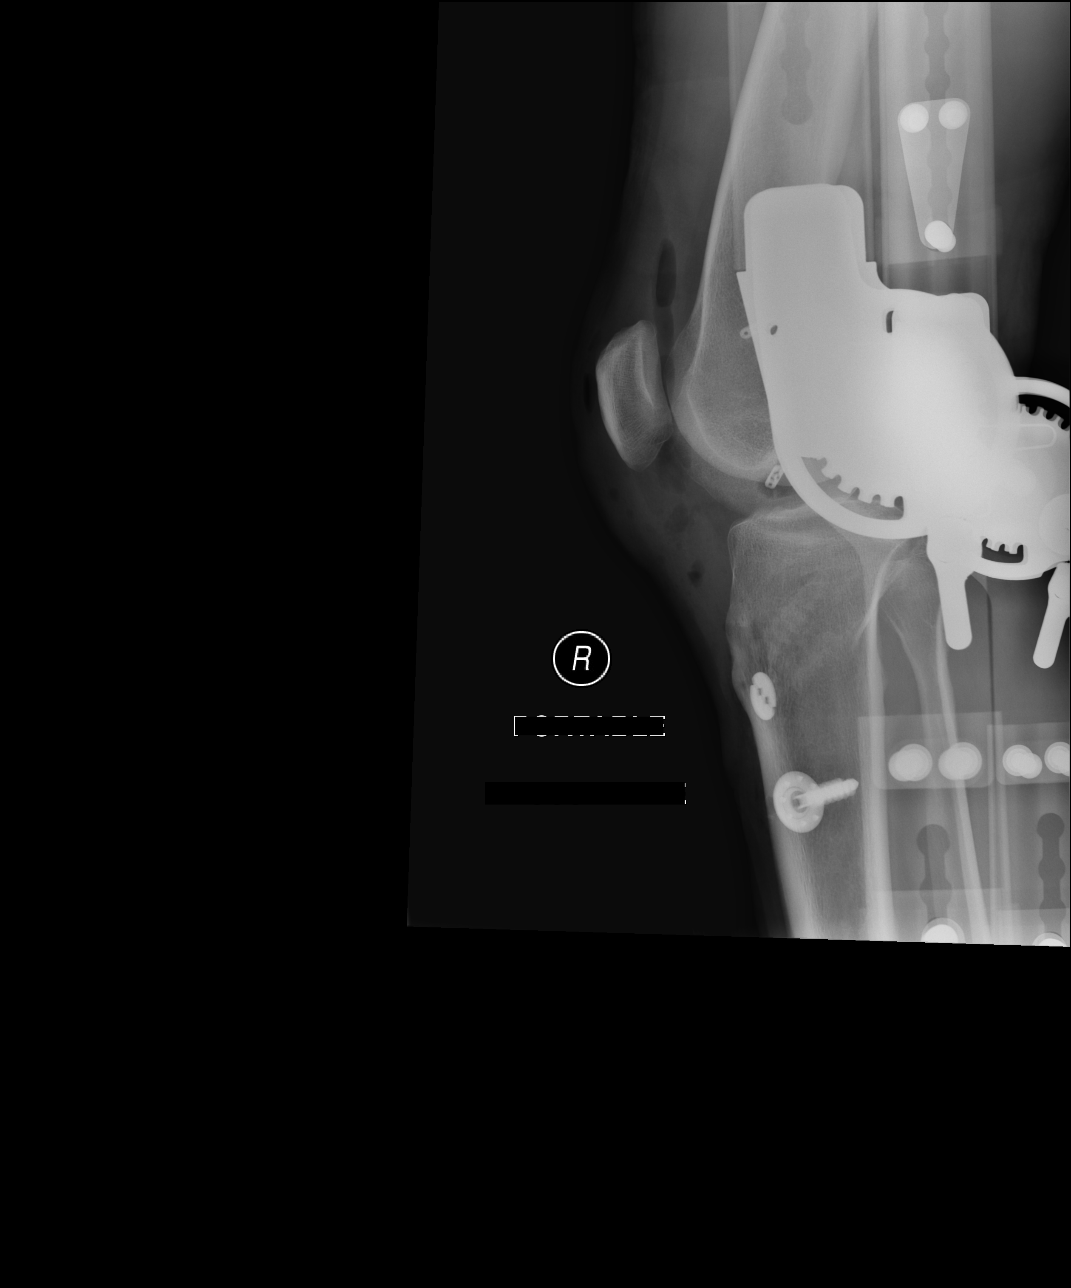

[lateral (2 of 2)]
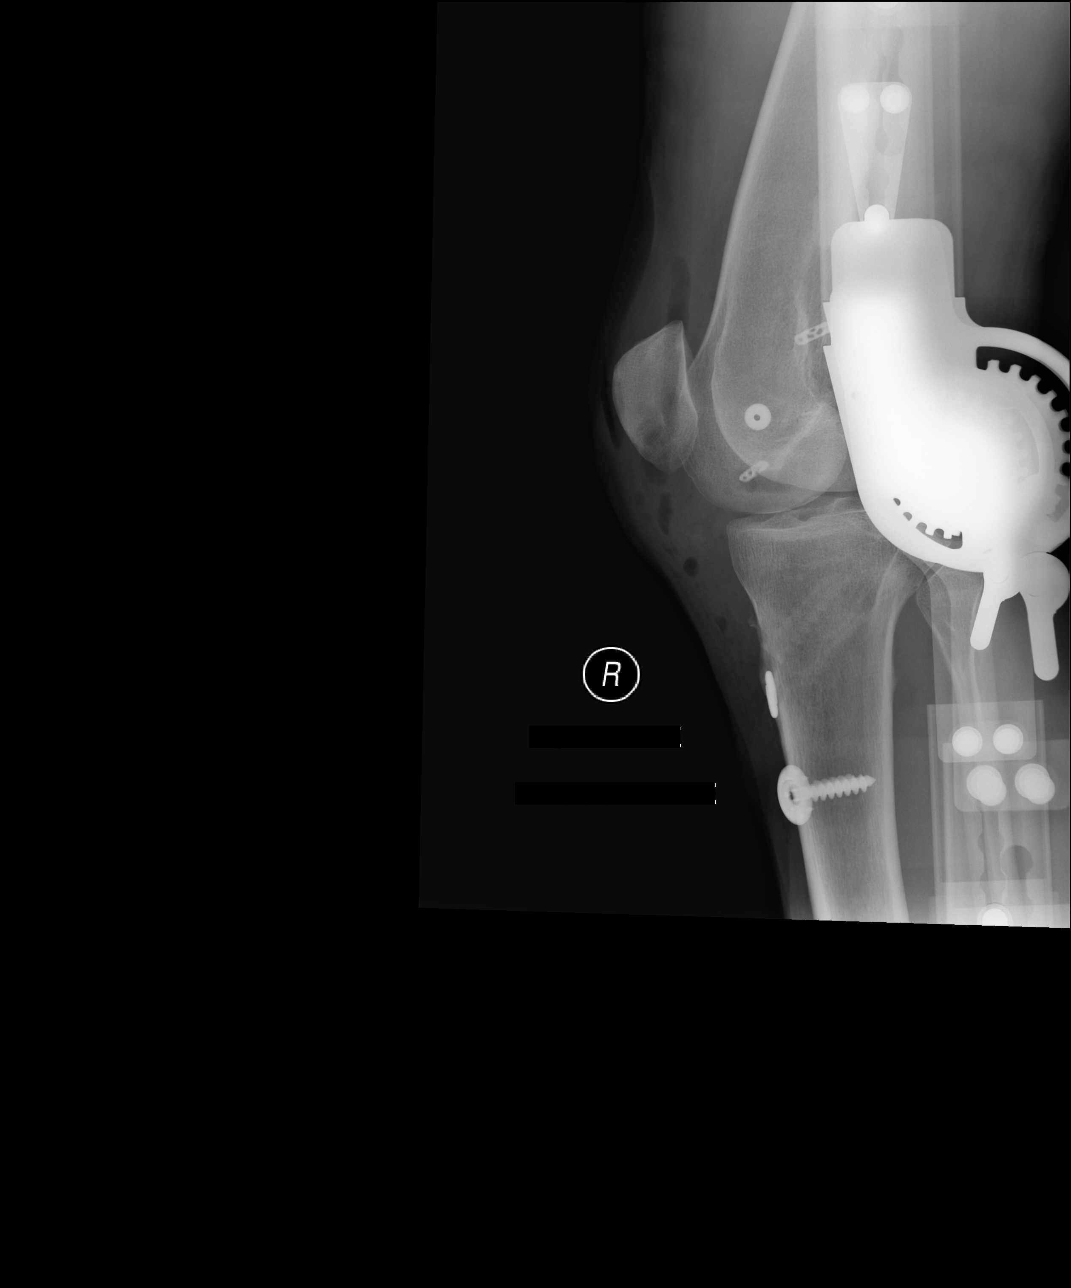

[3 of 3 positions shown; findings below may reference images not displayed]

FINDINGS: The patient has undergone knee arthroscopy with medial and lateral
meniscectomies, ACL repair, PCL reconstruction, MCL reconstruction,
patellar tendon repair, and ORIF of the tibial plateau fracture.
External brace noted. Fixation buttons for the ACL repair and screws
related to patellar tendon repair and likely MCL repair noted.
Improved appearance of the prior lateral tibial plateau fracture.
IMPRESSION: 1. Postoperative appearance with multi ligamentous
reconstruction/repair and tibial plateau ORIF. The tibial plateau
appears substantially improved. No complicating feature observed.

## 2021-10-01 SURGERY — ARTHROSCOPY, KNEE, WITH MEDIAL MENISCECTOMY
Anesthesia: General | Site: Knee | Laterality: Right

## 2021-10-01 MED ORDER — VANCOMYCIN HCL 1000 MG IV SOLR
INTRAVENOUS | Status: AC
  Filled 2021-10-01: qty 20

## 2021-10-01 MED ORDER — CHLORHEXIDINE GLUCONATE 0.12 % MT SOLN
OROMUCOSAL | Status: AC
  Administered 2021-10-01: 15 mL
  Filled 2021-10-01: qty 15

## 2021-10-01 MED ORDER — DEXAMETHASONE SODIUM PHOSPHATE 10 MG/ML IJ SOLN
INTRAMUSCULAR | Status: DC | PRN
  Administered 2021-10-01: 10 mg via INTRAVENOUS

## 2021-10-01 MED ORDER — FENTANYL CITRATE (PF) 100 MCG/2ML IJ SOLN: 100.0000 ug | Freq: Once | INTRAMUSCULAR | Status: AC

## 2021-10-01 MED ORDER — ACETAMINOPHEN 500 MG PO TABS: 1000.0000 mg | ORAL_TABLET | Freq: Three times a day (TID) | ORAL | 0 refills | Status: AC

## 2021-10-01 MED ORDER — ONDANSETRON HCL 4 MG PO TABS: 4.0000 mg | ORAL_TABLET | Freq: Three times a day (TID) | ORAL | 0 refills | Status: AC | PRN

## 2021-10-01 MED ORDER — GABAPENTIN 100 MG PO CAPS: 100.0000 mg | ORAL_CAPSULE | Freq: Three times a day (TID) | ORAL | 0 refills | Status: AC

## 2021-10-01 MED ORDER — OXYCODONE HCL 5 MG PO TABS: 5.0000 mg | ORAL_TABLET | Freq: Once | ORAL | Status: DC | PRN

## 2021-10-01 MED ORDER — GABAPENTIN 300 MG PO CAPS
300.0000 mg | ORAL_CAPSULE | Freq: Once | ORAL | Status: AC
  Administered 2021-10-01: 300 mg via ORAL
  Filled 2021-10-01: qty 1

## 2021-10-01 MED ORDER — BUPIVACAINE-EPINEPHRINE (PF) 0.5% -1:200000 IJ SOLN
INTRAMUSCULAR | Status: DC | PRN
Start: 2021-10-01 — End: 2021-10-01
  Administered 2021-10-01: 15 mL via PERINEURAL

## 2021-10-01 MED ORDER — MIDAZOLAM HCL 2 MG/2ML IJ SOLN: 2.0000 mg | Freq: Once | INTRAMUSCULAR | Status: AC

## 2021-10-01 MED ORDER — DEXAMETHASONE SODIUM PHOSPHATE 10 MG/ML IJ SOLN
INTRAMUSCULAR | Status: AC
  Filled 2021-10-01: qty 1

## 2021-10-01 MED ORDER — CEFAZOLIN SODIUM-DEXTROSE 2-4 GM/100ML-% IV SOLN
2.0000 g | INTRAVENOUS | Status: AC
  Administered 2021-10-01: 2 g via INTRAVENOUS
  Filled 2021-10-01: qty 100

## 2021-10-01 MED ORDER — MIDAZOLAM HCL 2 MG/2ML IJ SOLN
INTRAMUSCULAR | Status: AC
  Filled 2021-10-01: qty 2

## 2021-10-01 MED ORDER — EPINEPHRINE PF 1 MG/ML IJ SOLN
INTRAMUSCULAR | Status: AC
  Filled 2021-10-01: qty 2

## 2021-10-01 MED ORDER — ACETAMINOPHEN 500 MG PO TABS
1000.0000 mg | ORAL_TABLET | Freq: Once | ORAL | Status: DC
  Filled 2021-10-01: qty 2

## 2021-10-01 MED ORDER — TOBRAMYCIN SULFATE 1.2 G IJ SOLR
INTRAMUSCULAR | Status: AC
  Filled 2021-10-01: qty 1.2

## 2021-10-01 MED ORDER — FENTANYL CITRATE (PF) 250 MCG/5ML IJ SOLN
INTRAMUSCULAR | Status: AC
  Filled 2021-10-01: qty 5

## 2021-10-01 MED ORDER — BUPIVACAINE HCL (PF) 0.25 % IJ SOLN
INTRAMUSCULAR | Status: AC
  Filled 2021-10-01: qty 30

## 2021-10-01 MED ORDER — ASPIRIN 81 MG PO CHEW: 81.0000 mg | CHEWABLE_TABLET | Freq: Two times a day (BID) | ORAL | 0 refills | Status: AC

## 2021-10-01 MED ORDER — PHENYLEPHRINE 40 MCG/ML (10ML) SYRINGE FOR IV PUSH (FOR BLOOD PRESSURE SUPPORT)
PREFILLED_SYRINGE | INTRAVENOUS | Status: AC
  Filled 2021-10-01: qty 10

## 2021-10-01 MED ORDER — ACETAMINOPHEN 325 MG PO TABS: 325.0000 mg | ORAL_TABLET | ORAL | Status: DC | PRN

## 2021-10-01 MED ORDER — OXYCODONE HCL 5 MG PO TABS: ORAL_TABLET | ORAL | 0 refills | Status: AC

## 2021-10-01 MED ORDER — OXYCODONE HCL 5 MG/5ML PO SOLN: 5.0000 mg | Freq: Once | ORAL | Status: DC | PRN

## 2021-10-01 MED ORDER — ACETAMINOPHEN 10 MG/ML IV SOLN
INTRAVENOUS | Status: AC
  Filled 2021-10-01: qty 100

## 2021-10-01 MED ORDER — ONDANSETRON HCL 4 MG/2ML IJ SOLN
INTRAMUSCULAR | Status: AC
  Filled 2021-10-01: qty 2

## 2021-10-01 MED ORDER — PROPOFOL 10 MG/ML IV BOLUS
INTRAVENOUS | Status: DC | PRN
Start: 2021-10-01 — End: 2021-10-01
  Administered 2021-10-01: 200 mg via INTRAVENOUS
  Administered 2021-10-01: 20 mg via INTRAVENOUS

## 2021-10-01 MED ORDER — FENTANYL CITRATE (PF) 100 MCG/2ML IJ SOLN
INTRAMUSCULAR | Status: AC
  Administered 2021-10-01: 100 ug via INTRAVENOUS
  Filled 2021-10-01: qty 2

## 2021-10-01 MED ORDER — MEPERIDINE HCL 25 MG/ML IJ SOLN: 6.2500 mg | INTRAMUSCULAR | Status: DC | PRN

## 2021-10-01 MED ORDER — PHENYLEPHRINE 40 MCG/ML (10ML) SYRINGE FOR IV PUSH (FOR BLOOD PRESSURE SUPPORT)
PREFILLED_SYRINGE | INTRAVENOUS | Status: DC | PRN
  Administered 2021-10-01 (×6): 80 ug via INTRAVENOUS

## 2021-10-01 MED ORDER — CELECOXIB 100 MG PO CAPS
100.0000 mg | ORAL_CAPSULE | Freq: Two times a day (BID) | ORAL | 0 refills | Status: AC
Start: 2021-10-01 — End: 2021-10-31

## 2021-10-01 MED ORDER — FENTANYL CITRATE (PF) 250 MCG/5ML IJ SOLN
INTRAMUSCULAR | Status: DC | PRN
Start: 2021-10-01 — End: 2021-10-01
  Administered 2021-10-01 (×6): 50 ug via INTRAVENOUS

## 2021-10-01 MED ORDER — LIDOCAINE 2% (20 MG/ML) 5 ML SYRINGE
INTRAMUSCULAR | Status: AC
  Filled 2021-10-01: qty 5

## 2021-10-01 MED ORDER — FENTANYL CITRATE (PF) 100 MCG/2ML IJ SOLN: 25.0000 ug | INTRAMUSCULAR | Status: DC | PRN

## 2021-10-01 MED ORDER — LIDOCAINE 2% (20 MG/ML) 5 ML SYRINGE
INTRAMUSCULAR | Status: DC | PRN
Start: 2021-10-01 — End: 2021-10-01
  Administered 2021-10-01: 60 mg via INTRAVENOUS

## 2021-10-01 MED ORDER — BUPIVACAINE LIPOSOME 1.3 % IJ SUSP
INTRAMUSCULAR | Status: DC | PRN
  Administered 2021-10-01: 10 mL via PERINEURAL

## 2021-10-01 MED ORDER — MIDAZOLAM HCL 2 MG/2ML IJ SOLN
INTRAMUSCULAR | Status: AC
  Administered 2021-10-01: 2 mg via INTRAVENOUS
  Filled 2021-10-01: qty 2

## 2021-10-01 MED ORDER — ACETAMINOPHEN 10 MG/ML IV SOLN
INTRAVENOUS | Status: DC | PRN
  Administered 2021-10-01: 1000 mg via INTRAVENOUS

## 2021-10-01 MED ORDER — ACETAMINOPHEN 160 MG/5ML PO SOLN: 325.0000 mg | ORAL | Status: DC | PRN

## 2021-10-01 MED ORDER — ONDANSETRON HCL 4 MG/2ML IJ SOLN
INTRAMUSCULAR | Status: DC | PRN
  Administered 2021-10-01: 4 mg via INTRAVENOUS

## 2021-10-01 MED ORDER — ONDANSETRON HCL 4 MG/2ML IJ SOLN: 4.0000 mg | Freq: Once | INTRAMUSCULAR | Status: DC | PRN

## 2021-10-01 MED ORDER — LACTATED RINGERS IV SOLN: INTRAVENOUS | Status: DC

## 2021-10-01 SURGICAL SUPPLY — 104 items
ANCHOR BUTTON TIGHTROPE II FT (Anchor) ×2 IMPLANT
ANCHOR BUTTON TIGHTROPE II OP (Anchor) ×2 IMPLANT
ANCHOR BUTTON TIGHTROPE RN 14 (Anchor) ×2 IMPLANT
ANCHOR FBRTK 2.6 SUTURETAP 1.3 (Anchor) ×2 IMPLANT
ANCHOR PEEK 4.75X19.1 SWLK C (Anchor) ×2 IMPLANT
ANCHOR SUT FBRTK 2.6 KNTLS (Anchor) ×4 IMPLANT
ANCHOR TIGHTROPE II 20 W/IB (Anchor) ×2 IMPLANT
BAG COUNTER SPONGE SURGICOUNT (BAG) ×3 IMPLANT
BANDAGE ESMARK 6X9 LF (GAUZE/BANDAGES/DRESSINGS) ×2 IMPLANT
BLADE SHAVER BONE 5.0X13 (MISCELLANEOUS) ×2 IMPLANT
BLADE SURG 10 STRL SS (BLADE) ×3 IMPLANT
BLADE SURG 11 STRL SS (BLADE) ×2 IMPLANT
BLADE SURG 15 STRL LF DISP TIS (BLADE) ×2 IMPLANT
BLADE SURG 15 STRL SS (BLADE) ×1
BNDG COHESIVE 4X5 TAN STRL (GAUZE/BANDAGES/DRESSINGS) ×3 IMPLANT
BNDG COHESIVE 6X5 TAN STRL LF (GAUZE/BANDAGES/DRESSINGS) ×3 IMPLANT
BNDG ELASTIC 4X5.8 VLCR STR LF (GAUZE/BANDAGES/DRESSINGS) ×2 IMPLANT
BNDG ELASTIC 6X10 VLCR STRL LF (GAUZE/BANDAGES/DRESSINGS) ×2 IMPLANT
BNDG ELASTIC 6X5.8 VLCR STR LF (GAUZE/BANDAGES/DRESSINGS) ×1 IMPLANT
BNDG ESMARK 6X9 LF (GAUZE/BANDAGES/DRESSINGS) ×3
BNDG GAUZE ELAST 4 BULKY (GAUZE/BANDAGES/DRESSINGS) ×3 IMPLANT
CANNULA TWIST IN 8.25X7CM (CANNULA) ×2 IMPLANT
CHLORAPREP W/TINT 26 (MISCELLANEOUS) ×9 IMPLANT
COVER MAYO STAND STRL (DRAPES) ×3 IMPLANT
COVER SURGICAL LIGHT HANDLE (MISCELLANEOUS) ×3 IMPLANT
CUFF TOURN SGL QUICK 34 (TOURNIQUET CUFF) ×1
CUFF TRNQT CYL 34X4.125X (TOURNIQUET CUFF) ×1 IMPLANT
DRAPE C-ARM 42X72 X-RAY (DRAPES) ×3 IMPLANT
DRAPE C-ARMOR (DRAPES) ×3 IMPLANT
DRAPE IMP U-DRAPE 54X76 (DRAPES) ×6 IMPLANT
DRAPE INCISE IOBAN 66X45 STRL (DRAPES) ×3 IMPLANT
DRAPE U-SHAPE 47X51 STRL (DRAPES) ×3 IMPLANT
DRILL FLIPCUTTER III 6-12 (ORTHOPEDIC DISPOSABLE SUPPLIES) ×1 IMPLANT
DRSG PAD ABDOMINAL 8X10 ST (GAUZE/BANDAGES/DRESSINGS) ×3 IMPLANT
ELECT REM PT RETURN 9FT ADLT (ELECTROSURGICAL) ×3
ELECTRODE REM PT RTRN 9FT ADLT (ELECTROSURGICAL) ×2 IMPLANT
FIBERSTICK 2 (SUTURE) ×2 IMPLANT
FLIPCUTTER III 6-12 AR-1204FF (ORTHOPEDIC DISPOSABLE SUPPLIES) ×3
GAUZE SPONGE 4X4 12PLY STRL (GAUZE/BANDAGES/DRESSINGS) ×3 IMPLANT
GAUZE SPONGE 4X4 12PLY STRL LF (GAUZE/BANDAGES/DRESSINGS) ×3 IMPLANT
GAUZE XEROFORM 1X8 LF (GAUZE/BANDAGES/DRESSINGS) ×1 IMPLANT
GLOVE SRG 8 PF TXTR STRL LF DI (GLOVE) ×2 IMPLANT
GLOVE SURG ENC MOIS LTX SZ6.5 (GLOVE) ×3 IMPLANT
GLOVE SURG LTX SZ8 (GLOVE) ×6 IMPLANT
GLOVE SURG UNDER LTX SZ6.5 (GLOVE) ×3 IMPLANT
GLOVE SURG UNDER POLY LF SZ8 (GLOVE) ×1
GOWN STRL REUS W/ TWL LRG LVL3 (GOWN DISPOSABLE) ×4 IMPLANT
GOWN STRL REUS W/ TWL XL LVL3 (GOWN DISPOSABLE) ×4 IMPLANT
GOWN STRL REUS W/TWL LRG LVL3 (GOWN DISPOSABLE) ×2
GOWN STRL REUS W/TWL XL LVL3 (GOWN DISPOSABLE) ×2
GRAFT ACHILLES CALC BNE BLCK (Bone Implant) IMPLANT
GRAFT ACHILLES TENDON (Bone Implant) ×3 IMPLANT
GRAFT TISS 230-320 GRACILIS (Bone Implant) IMPLANT
GRAFT TISS 65-80 FRZN TENDON (Tissue) IMPLANT
GRAFT TISS SEMITEND 4-8 (Bone Implant) IMPLANT
IMMOBILIZER KNEE 22 UNIV (SOFTGOODS) IMPLANT
KIT BASIN OR (CUSTOM PROCEDURE TRAY) ×3 IMPLANT
KIT TRANSTIBIAL (DISPOSABLE) ×2 IMPLANT
KIT TURNOVER KIT B (KITS) ×3 IMPLANT
MANIFOLD NEPTUNE II (INSTRUMENTS) ×7 IMPLANT
NDL TAPERED W/ NITINOL LOOP (MISCELLANEOUS) IMPLANT
NEEDLE TAPERED W/ NITINOL LOOP (MISCELLANEOUS) ×3 IMPLANT
NS IRRIG 1000ML POUR BTL (IV SOLUTION) ×3 IMPLANT
PACK ORTHO EXTREMITY (CUSTOM PROCEDURE TRAY) ×2 IMPLANT
PAD ARMBOARD 7.5X6 YLW CONV (MISCELLANEOUS) ×6 IMPLANT
PAD CAST 4YDX4 CTTN HI CHSV (CAST SUPPLIES) ×1 IMPLANT
PADDING CAST COTTON 4X4 STRL (CAST SUPPLIES)
PADDING CAST COTTON 6X4 STRL (CAST SUPPLIES) ×3 IMPLANT
PORT APPOLLO RF 90DEGREE MULTI (SURGICAL WAND) ×2 IMPLANT
SCREW FT BIOCOMP 9X30 (Screw) ×2 IMPLANT
SCREW LO PRO 30MM (Screw) ×2 IMPLANT
SCREW SHEATHED INTERF 8X20MM (Screw) ×2 IMPLANT
SPONGE T-LAP 18X18 ~~LOC~~+RFID (SPONGE) ×2 IMPLANT
STAPLER VISISTAT 35W (STAPLE) ×3 IMPLANT
STOCKINETTE IMPERVIOUS LG (DRAPES) ×3 IMPLANT
STRIP CLOSURE SKIN 1/2X4 (GAUZE/BANDAGES/DRESSINGS) ×2 IMPLANT
SUCTION FRAZIER HANDLE 10FR (MISCELLANEOUS) ×1
SUCTION TUBE FRAZIER 10FR DISP (MISCELLANEOUS) ×2 IMPLANT
SUT FIBERWIRE #2 38 T-5 BLUE (SUTURE) ×3
SUT MON AB 3-0 SH 27 (SUTURE) ×1
SUT MON AB 3-0 SH27 (SUTURE) ×2 IMPLANT
SUT VIC AB 0 CT1 18XCR BRD 8 (SUTURE) ×2 IMPLANT
SUT VIC AB 0 CT1 27 (SUTURE) ×2
SUT VIC AB 0 CT1 27XBRD ANBCTR (SUTURE) ×2 IMPLANT
SUT VIC AB 0 CT1 8-18 (SUTURE) ×1
SUT VIC AB 1 CT1 27 (SUTURE) ×1
SUT VIC AB 1 CT1 27XBRD ANBCTR (SUTURE) ×2 IMPLANT
SUT VIC AB 3-0 SH 27 (SUTURE) ×1
SUT VIC AB 3-0 SH 27X BRD (SUTURE) ×1 IMPLANT
SUTURE FIBERWR #2 38 T-5 BLUE (SUTURE) ×1 IMPLANT
SUTURE TAPE 1.3 40 TPR END (SUTURE) ×1 IMPLANT
SUTURETAPE 1.3 40 TPR END (SUTURE) ×3
TENDON GRACILIS FROZEN (Bone Implant) ×3 IMPLANT
TENDON GRACILIS FROZEN 230-320 (Bone Implant) ×2 IMPLANT
TENDON SEMI-TENDINOSUS (Bone Implant) ×3 IMPLANT
TISSUE GRAFTLINK 65-95MML (Tissue) ×3 IMPLANT
TOWEL GREEN STERILE (TOWEL DISPOSABLE) ×6 IMPLANT
TOWEL GREEN STERILE FF (TOWEL DISPOSABLE) ×3 IMPLANT
TUBE CONNECTING 12X1/4 (SUCTIONS) ×3 IMPLANT
TUBE SUCTION HIGH CAP CLEAR NV (SUCTIONS) ×3 IMPLANT
TUBING ARTHROSCOPY IRRIG 16FT (MISCELLANEOUS) ×2 IMPLANT
WASHER SPIKED 18MM (Orthopedic Implant) ×2 IMPLANT
WATER STERILE IRR 1000ML POUR (IV SOLUTION) ×3 IMPLANT
YANKAUER SUCT BULB TIP NO VENT (SUCTIONS) ×3 IMPLANT

## 2021-10-01 NOTE — Op Note (Signed)
Orthopaedic Surgery Operative Note (CSN: [id number])  Vincent Mccarthy  05/22/1971 Date of Surgery: 10/01/2021   Diagnoses:  Right knee multi ligamentous injury with ACL, PCL, medial collateral, medial posterior capsular tearing, patellar tearing and medial and lateral meniscus tears with tibial plateau fracture  Procedure: Right ACL reconstruction with allograft Right arthroscopic PCL reconstruction Right medial collateral ligament reconstruction with Achilles allograft Right medial posterior capsular repair Right patellar tendon repair Right medial and lateral partial meniscectomies Right closed management of tibial plateau fracture   Operative Finding Exam under anesthesia: Grossly unstable to a valgus stress as well as anterior and posterior drawer testing, no recurvatum Suprapatellar pouch: Normal Patellofemoral Compartment: Normal Medial Compartment: Clear drive-through sign, small medial meniscus tear on the central portion debrided back, 5% total meniscal volume resected Lateral Compartment: Complete transection of the lateral meniscus at the popliteal hiatus, 40% total meniscal volume resected but no residual radial fibers noted. Intercondylar Notch: Complete rupture of the ACL and PCL.  Successful completion of the planned procedure.  Patient's posterior lateral tibial plateau was primarily beneath his meniscus.  We did not feel that it was appropriate for repair and treated closed.  His ligamentous reconstruction was robust.  Unfortunately we noted that he had rupture of the medial half of his patellar tendon and even with repair this is a high risk for failure.  The patient's risks overall are quite high with stiffness and recurrent loosening both being potentially significant problems.  Post-operative plan: The patient will be touchdown weightbearing following the multi ligamentous protocol modified for his patellar tendon rupture to avoid active knee extension and  aggressive knee flexion.  The patient will be discharged home.  DVT prophylaxis Aspirin 81 mg twice daily for 6 weeks.  Pain control with PRN pain medication preferring oral medicines.  Follow up plan will be scheduled in approximately 7 days for incision check and XR.  Post-Op Diagnosis: Same Surgeons:Primary: Bjorn Pippin, MD Assistants:Caroline McBane PA-C Location: [facility] OR ROOM 05 Anesthesia: General with adductor canal Antibiotics:  Ancef 2 g IV, 1 g of vancomycin powder, 1.2 of tobramycin powder Tourniquet time:  Total Tourniquet Time Documented: Thigh (Right) - 121 minutes Total: Thigh (Right) - 121 minutes  Estimated Blood Loss: Minimal Complications: None Specimens: None Implants: Implant Name Type Inv. Item Serial No. Manufacturer Lot No. LRB No. Used Action  ANCHOR SUT FBRTK 2.6 KNTLS - [id number] Anchor ANCHOR SUT FBRTK 2.6 KNTLS  [employer] [id number] Right 2 Implanted  ANCHOR TIGHTROPE II 20 W/IB - [id number] Anchor ANCHOR TIGHTROPE II 20 W/IB  [employer] [id number] Right 1 Implanted  [address] II FT - [id number] Anchor ANCHOR [place]  [employer] [id number] Right 1 Implanted  ANCHOR BUTTON TIGHTROPE II OP - [id number] Anchor ANCHOR BUTTON TIGHTROPE II OP  [employer] [id number] Right 1 Implanted  SCREW FT BIOCOMP 9X30 - [id number] Screw SCREW FT BIOCOMP 9X30  [employer] [id number] Right 1 Implanted  SCREW LO PRO - [id number] Screw SCREW LO PRO  [employer] [id number] Right 1 Implanted  ANCHOR FBRTK 2.6 SUTURETAP 1.3 - [id number] Anchor ANCHOR FBRTK 2.6 SUTURETAP 1.3  [employer] [id number] Right 1 Implanted  WASHER SPIKED - [id number] Orthopedic Implant WASHER SPIKED  [employer] [id number] Right 1 Implanted  SCREW SHEATHED INTERF 8X20MM - [id number] Screw SCREW SHEATHED INTERF 8X20MM  [employer] [id number] Right 1 Implanted  Linton Flemings TIGHTROPE RN 14 [id number] Anchor ANCHOR BUTTON TIGHTROPE RN 14  [employer]  [id number] Right 1 Implanted   ANCHOR PEEK 4.75X19.1 Warnell Forester - [id number] Anchor ANCHOR PEEK 4.75X19.1 [employer] [id number] Right 1 Implanted  TENDON SEMI-TENDINOSUS - [id number] Bone Implant TENDON SEMI-TENDINOSUS [id number] [employer]  Right 1 Implanted  GRAFT ACHILLES TENDON - [id number] Bone Implant GRAFT ACHILLES TENDON [id number] [employer]  Right 1 Implanted  TENDON GRACILIS FROZEN - [id number] Bone Implant TENDON GRACILIS FROZEN [id number] [employer]  Right 1 Implanted  TISSUE GRAFTLINK 65-95MML - [id number] Tissue TISSUE GRAFTLINK 65-95MML [id number] [employer]  Right 1 Implanted    Indications for Surgery:   Vincent Mccarthy is a 51 y.o. male with fall from roof resulting in multiple injuries while working including a multi ligamentous knee injury with contralateral acute traumatic rotator cuff rupture.  Benefits and risks of operative and nonoperative management were discussed prior to surgery with patient/guardian(s) and informed consent form was completed.  Specific risks including infection, need for additional surgery, loosening, stiffness, loss of extensor mechanism amongst others   Procedure:   The patient was identified properly. Informed consent was obtained and the surgical site was marked. The patient was taken up to suite where general anesthesia was induced. The patient was placed in the supine position with a post against the surgical leg and a nonsterile tourniquet applied. The surgical leg was then prepped and draped usual sterile fashion.  A standard surgical timeout was performed.  2 standard anterior portals were made and diagnostic arthroscopy performed. Please note the findings as noted above.  We cleared the joint performing a medial and lateral meniscectomy as above.  We used a shaver and a basket to trim the meniscus back to stable base.  The posterior lateral plateau was examined through the scope was noted to be only about 3 to 4 mm depressed and  was not amenable to movement with an open reduction internal fixation thus it was treated closed.  We turned our attention to the ACL.  We used an allograft was prepared on the back table to 9 mm.  Once this was prepared we performed a notchplasty using a shaver and bur.  We identified the anatomic position of the ACL and marked it.  We then used a retrograde reamer in the form of a flip cutter to ream a 9 mm socket on the femur.  We used a guide for this.  We had a good backwall but it is posterior as was possible.  We then freehand drilled our tibial tunnel taking care to avoid our likely position of her tibial tunnel for the PCL.  Once this was drilled we shuttled suture through it.  We turned our attention to the PCL.  PCL Reconstruction: At this point we began to identify the stump off the femur of the PCL.  This was grossly loose.  We began by sharply excising stump that was residual of the PCL taking care to not injure the ACL.  This point we were able to see to the back of the knee by placing the scope medial to the ACL fibers.  We then under direct visualization using spinal needle to localize and carefully place a posterior medial portal avoiding the posterior neurovascular structures.  We opened the capsule bluntly before placing a hard body cannula.  This point we checked our excursion and make sure that we were able to reach posterior lateral and distal on the tibia.  We then proceeded with a shaver and a radiofrequency ablator to clear the posterior stump of  the PCL from its origin on the tibia.  At this point we checked with fluoroscopy to ensure that we were low enough just above the posterior flare of the tibia.    We then marked and cleared the central portion of the tibial insertion on the femur identifying a point just off the articular margin with enough room to allow a 11 mm reamer to avoid damage to the cartilage.  At this point we used the Arthrex PCL guide set with a flip  cutter to first place a 2.4 mm guide pin, check her position on fluoroscopy and switch this for a 11mm flip cutter.  We obtained good visualization of the tip of the instrument and sure that it never passed into the capsule posteriorly.  The flip cutter was deployed and was reamed through the anteromedial tibia in typical fashion.  We then used a retro-reamer set guide to identify the femoral footprint of the PCL that we had marked and reamed a 25 mm tunnel into the femur again with a flip cutter taking care to avoid reaming out the medial cortex of the femur.  The graft was an allograft graft link Arthrex graft size 11 with buttons attached to both ends.  This point passing sutures were placed through both of these tunnels after bony debris was cleared and returned our attention to the remainder of the case.  MCL Reconstruction: We began with a incision starting at the medial epicondyle and going down just distal to the pedis anserine us.  We dissected through soft tissues bluntly take care to protect posterior structures including the saphenous nerve and vein.  We achieved hemostasis to be progressed were able to identify the distal insertion of the MCL.  There is a complete MCL rupture noted with significant soft tissue trauma in this area.  Joint line was palpated and were able to find the epicondyle within incision.  At this point we placed a 2.4 mm guidepin just proximal and posterior to the epicondyle and the MCL origin and used fluoroscopy to verify its position.  Once were happy with this position we then placed another guidewire at the insertion on the tibia and checked for graft isometry which was appropriate with less than 2 mm of and isometry.  We then removed the tibial pin and reamed a 25 mm deep 10 mm tunnel into the femur.  It was cleared of soft tissue.  We had good walls on all sides of the tunnel.  We then prepped the graft in the form of allograft with a 10 mm bone block and the  full length of the Achilles soft tissue itself.  We used a bone tamp to place the bone block which was 20 mm in length into our tunnel in the femur and it was secured with a 8 x 20 mm metal screw.  We then turned our attention to the fixation for the deep MCL as well as the superficial MCL along the tibia.  We placed 1 double loaded fiber tack anchors 1 cm proximal to the joint line on the femur and two 1 cm distal to the joint line on the tibia.    These were double loaded suture anchors in each limb was passed in a Mason-Allen type fashion to tension the graft as it was tied with the remaining limb as post.  Once all the limbs were passed we then began tying starting near the joint and progressing away from the joint as we proceeded.  We  held the knee in slight varus with 20 degrees of flexion to avoid capturing the knee while tying these knots.  We used a FiberWire to repair the posterior medial capsule imbricating this tissue as well as the MCL tissue natively.  We then turned our attention to the distal most fixation.  We cleared an area distal to the pes tendons and used an arthrex soft tissue screw and washer construct to drill then place a cancellous screw holding tension on the graft as we tightened the screw.  We passed the graft underneath the pes tendon.  We sewed the graft back to itself to avoid a pulley release of our interference screw.  The sutures were cut and we imbricated the remaining native MCL tissue into our graft.  We had a robust fixation and good stability to a valgus stress that was gently placed in the operating room at 0,30, and 60 degrees.  We examined the medial aspect of the patellar tendon and noted it was ruptured mid substance at the distal aspect.  We used suture tape and FiberWire to perform an end-to-end repair of the tissue itself.  We then placed a 2.6 fiber tack anchor loaded with suture tape to reinforce our repair.  Once this was complete we turned attention  back to the intra-articular portion of the case.  We shuttled our PCL graft into our tibial tunnel through the medial portal and then back up into the femur.  We checked that our buttons were directly on bone.  That point we held an anterior drawer and the joint reduced and were able to tension both buttons checking position.  Once positioned appropriately with this we turned our attention to the ACL.  We shuttled the graft through the tibial tunnel into the femur.  We checked our button position on fluoroscopy ensured it was directly on bone.  We tensioned our button and then proceeded to remove any slack from the system cycling the knee placing it about 25 degrees of flexion and fixing the tibia with a 9 x 30 interference screw.  We had good purchase.  Due to the multiple ligamentous injuries we felt the backing up our tibial fixation of the PCL and the ACL was appropriate.  We used a 4.75 peek swivel lock after drilling and tapping to obtain fixation.  We had good fixation with this.  Final construct had a good ACL and PCL exam as well as no instability to valgus stress.  At this point we turned our attention back to the intra-articular portion of the case.  Incisions closed with absorbable suture. The patient was awoken from general anesthesia and taken to the PACU in stable condition without complication.   Alfonse Alpersaroline McBane, PA-C, present and scrubbed throughout the case, critical for completion in a timely fashion, and for retraction, instrumentation, closure.

## 2021-10-01 NOTE — Discharge Instructions (Signed)
Ramond Marrow MD, MPH Alfonse Alpers, PA-C [facility] [address], [address] [phone] (tel)   [phone] (fax)   POST-OPERATIVE INSTRUCTIONS - Meniscus Repair  WOUND CARE - You may remove the Operative Dressing on Post-Op Day #3 (72hrs after surgery).   - Alternatively if you would like you can leave dressing on until follow-up if within 7-8 days but keep it dry. - Leave steri-strips in place until they fall off on their own, usually 2 weeks postop. - An ACE wrap may be used to control swelling, do not wrap this too tight.  If the initial ACE wrap feels too tight you may loosen it. - There may be a small amount of fluid/bleeding leaking at the surgical site.  - This is normal; the knee is filled with fluid during the procedure and can leak for 24-48hrs after surgery.  - You may change/reinforce the bandage as needed.  - Use the Cryocuff or Ice as often as possible for the first 7 days, then as needed for pain relief. Always keep a towel, ACE wrap or other barrier between the cooling unit and your skin.  - You may shower on Post-Op Day #3. Gently pat the area dry. Do not soak the knee in water or submerge it.  - Do not go swimming in the pool or ocean until 4 weeks after surgery or when otherwise instructed.  Keep incisions as dry as possible.   BRACE/AMBULATION - You will be placed in a brace post-operatively.  - Wear your brace at all times until follow-up.  - You may remove for hygiene ONLY. -           Use crutches or a walker to help you ambulate -           Touch-down weight bearing: when you stand or walk, you may only touch your foot to the floor for balance -           Do NOT put any body weight on your leg   REGIONAL ANESTHESIA (NERVE BLOCKS) - The anesthesia team may have performed a nerve block for you if safe in the setting of your care.  This is a great tool used to minimize pain.  Typically the block may start wearing off overnight.  This  can be a challenging period but please utilize your as needed pain medications to try and manage this period and know it will be a brief transition as the nerve block wears completely   POST-OP MEDICATIONS - Multimodal approach to pain control - In general your pain will be controlled with a combination of substances.  Prescriptions unless otherwise discussed are electronically sent to your pharmacy.  This is a carefully made plan we use to minimize narcotic use.     - Celebrex - Anti-inflammatory medication taken on a scheduled basis - Acetaminophen - Non-narcotic pain medicine taken on a scheduled basis  - Gabapentin - this is a medication to help with nerve based pain, take on a scheduled basis - Oxycodone - This is a strong narcotic, to be used only on an "as needed" basis for SEVERE pain. - Aspirin 81mg  - This medicine is used to minimize the risk of blood clots after surgery. - Zofran - take as needed for nausea  FOLLOW-UP   Please call the office to schedule a follow-up appointment for your incision check, 7-10 days post-operatively.  IF YOU HAVE ANY QUESTIONS, PLEASE FEEL FREE TO CALL OUR OFFICE.   HELPFUL  INFORMATION  - If you had a block, it will wear off between 8-24 hrs postop typically.  This is period when your pain may go from nearly zero to the pain you would have had post-op without the block.  This is an abrupt transition but nothing dangerous is happening.  You may take an extra dose of narcotic when this happens.   Keep your leg elevated to decrease swelling, which will then in turn decrease your pain. I would elevate the foot of your bed by putting a couple of couch pillows between your mattress and box spring. I would not keep pillow directly under your ankle.  - Do not sleep with a pillow behind your knee even if it is more comfortable as this may make it harder to get your knee fully straight long term.   There will be MORE swelling on days 1-3 than there is on  the day of surgery.  This also is normal. The swelling will decrease with the anti-inflammatory medication, ice and keeping it elevated. The swelling will make it more difficult to bend your knee. As the swelling goes down your motion will become easier   You may develop swelling and bruising that extends from your knee down to your calf and perhaps even to your foot over the next week. Do not be alarmed. This too is normal, and it is due to gravity   There may be some numbness adjacent to the incision site. This may last for 6-12 months or longer in some patients and is expected.   You may return to sedentary work/school in the next couple of days when you feel up to it. You will need to keep your leg elevated as much as possible    You should wean off your narcotic medicines as soon as you are able.  Most patients will be off or using minimal narcotics before their first postop appointment.    We suggest you use the pain medication the first night prior to going to bed, in order to ease any pain when the anesthesia wears off. You should avoid taking pain medications on an empty stomach as it will make you nauseous.   Do not drink alcoholic beverages or take illicit drugs when taking pain medications.   It is against the law to drive while taking narcotics. You cannot drive if your Right leg is in brace locked in extension.   Pain medication may make you constipated.  Below are a few solutions to try in this order:  o Decrease the amount of pain medication if you aren't having pain.  o Drink lots of decaffeinated fluids.  o Drink prune juice and/or each dried prunes   o If the first 3 don't work start with additional solutions  o Take Colace - an over-the-counter stool softener  o Take Senokot - an over-the-counter laxative  o Take Miralax - a stronger over-the-counter laxative   For more information including helpful videos and documents visit our website:    [email or web address]

## 2021-10-01 NOTE — H&P (Signed)
PREOPERATIVE H&P  Chief Complaint: right knee bilateral meniscus tear, ACL, PCL, MCL tear, patella tendon rupture, tibial plateau fracture.  HPI: Vincent Mccarthy is a 51 y.o. male who is scheduled for, Procedure(s): KNEE ARTHROSCOPY WITH MEDIAL MENISECTOMY KNEE ARTHROSCOPY WITH LATERAL MENISECTOMY KNEE ARTHROSCOPY WITH MENISCAL REPAIR/medial and lateral ANTERIOR CRUCIATE LIGAMENT (ACL) REPAIR KNEE ARTHROSCOPY WITH POSTERIOR CRUCIATE LIGAMENT (PCL) RECONSTRUCTION RECONSTRUCTION MEDIAL COLLATERAL LIGAMENT (MCL) PATELLA TENDON REPAIR OPEN REDUCTION INTERNAL FIXATION (ORIF) TIBIAL PLATEAU.   Patient has a past medical history significant for HLD.   In short patient had a fall off a roof at work. He was noted to have a left multi ligamentous knee injury involving the ACL PCL MCL and a posterior lateral tibial plateau fracture.  He additionally had medial lateral meniscus tears and some injury to his patellar tendon.  The patient additionally was noted to have a traumatic cuff tear involving the left shoulder.  Has been mobilizing with therapy using a walker.  He has been maintaining his nonweightbearing status on his knee.  His symptoms are rated as moderate to severe, and have been worsening.  This is significantly impairing activities of daily living.    Please see clinic note for further details on this patient's care.    He has elected for surgical management.   Past Medical History:  Diagnosis Date   COVID 2022   mild   High cholesterol    Pneumonia    Past Surgical History:  Procedure Laterality Date   NO PAST SURGERIES     Social History   Socioeconomic History   Marital status: Married    Spouse name: Not on file   Number of children: Not on file   Years of education: Not on file   Highest education level: Not on file  Occupational History   Not on file  Tobacco Use   Smoking status: Never   Smokeless tobacco: Never  Vaping Use   Vaping Use: Never  used  Substance and Sexual Activity   Alcohol use: Yes    Comment: occ   Drug use: Never   Sexual activity: Not on file  Other Topics Concern   Not on file  Social History Narrative   Not on file   Social Determinants of Health   Financial Resource Strain: Not on file  Food Insecurity: Not on file  Transportation Needs: Not on file  Physical Activity: Not on file  Stress: Not on file  Social Connections: Not on file   History reviewed. No pertinent family history. Not on File Prior to Admission medications   Medication Sig Start Date End Date Taking? Authorizing Provider  acetaminophen (TYLENOL) 500 MG tablet Take 1,000 mg by mouth every 6 (six) hours as needed for moderate pain or headache.    [provider]  docusate sodium (COLACE) 100 MG capsule Take 1 capsule (100 mg total) by mouth 2 (two) times daily. 09/27/21   Jill Alexanders, PA-C  ibuprofen (ADVIL) 800 MG tablet Take 1 tablet (800 mg total) by mouth every 8 (eight) hours as needed. 09/27/21   Jill Alexanders, PA-C  lidocaine (LIDODERM) 5 % Place 1 patch onto the skin daily. CHEST WALL. Remove & Discard patch within 12 hours or as directed by MD 09/28/21   Jill Alexanders, PA-C  methocarbamol (ROBAXIN) 500 MG tablet Take 1 tablet (500 mg total) by mouth every 8 (eight) hours as needed for muscle spasms (pain). 09/27/21   Jill Alexanders, PA-C  oxyCODONE (OXY IR/ROXICODONE) 5 MG immediate release tablet Take 1 tablet (5 mg total) by mouth every 6 (six) hours as needed for severe pain or breakthrough pain. 09/27/21   Jill Alexanders, PA-C  prednisoLONE acetate (PRED FORTE) 1 % ophthalmic suspension Place 1 drop into both eyes daily.    [provider]  rosuvastatin (CRESTOR) 20 MG tablet Take 20 mg by mouth at bedtime.    [provider]  tamsulosin (FLOMAX) 0.4 MG CAPS capsule Take 0.4 mg by mouth at bedtime.    [provider]    ROS: All other systems have been reviewed  and were otherwise negative with the exception of those mentioned in the HPI and as above.  Physical Exam: General: Alert, no acute distress Cardiovascular: No pedal edema Respiratory: No cyanosis, no use of accessory musculature GI: No organomegaly, abdomen is soft and non-tender Skin: No lesions in the area of chief complaint Neurologic: Sensation intact distally Psychiatric: Patient is competent for consent with normal mood and affect Lymphatic: No axillary or cervical lymphadenopathy  MUSCULOSKELETAL:    Right knee: Limited exam secondary to patient pain.  Distal motor and sensory function intact.  Patient has gross instability with a valgus stress across the knee.  Imaging: MRI and CT of the knee were reviewed which demonstrate a 3 ligament knee injury with possible injury to the posterior lateral corner as well.  The patellar tendon is also injured.  Medial lateral meniscus tear is noted.  Posterior lateral tibial plateau fracture noted as well.  Assessment: right knee bilateral meniscus tear, ACL, PCL, MCL tear, patella tendon rupture, tibial plateau fracture.  Plan: Plan for Procedure(s): KNEE ARTHROSCOPY WITH MEDIAL MENISECTOMY KNEE ARTHROSCOPY WITH LATERAL MENISECTOMY KNEE ARTHROSCOPY WITH MENISCAL REPAIR/medial and lateral ANTERIOR CRUCIATE LIGAMENT (ACL) REPAIR KNEE ARTHROSCOPY WITH POSTERIOR CRUCIATE LIGAMENT (PCL) RECONSTRUCTION RECONSTRUCTION MEDIAL COLLATERAL LIGAMENT (MCL) PATELLA TENDON REPAIR OPEN REDUCTION INTERNAL FIXATION (ORIF) TIBIAL PLATEAU  Per Dr. Rich Fuchs consult note: "We had a long discussion with the patient and his family regarding his options.  Operating on the knee and shoulder in the same setting is essentially impossible.  This is due to patient positioning issues as well as operative time.  Additionally doing both extremities would likely leave the patient bedridden for 6 to 8 weeks.  As the patient had no previous injury to the shoulder we feel  it is likely in his best interest to consider operative intervention of the knee allowing him to keep his nonweightbearing status for 6 weeks and then appropriately address his shoulder.  As this is a traumatic injury his outcomes in the first few months should be reasonable if we leave the shoulder for a later date.   If patient is mobilizing safely and has no objections from the remainder of his team would be appropriate for discharge with return to [facility] for a outpatient surgery performing a allograft ACL, MCL, PCL reconstruction with possible open reduction internal fixation of the lateral tibial plateau, possible posterior lateral corner reconstruction and possible repair of the patellar tendon with medial and lateral meniscectomy versus repair.  This is a very complex surgery and will take at least 3-1/2 hours.  Patient's risks include but are not limited to infection, stiffness, loosening and the need for revision surgery.   The plan would be after 6 to 8 weeks from the surgery proceeding with his rotator cuff repair.  For now he will be weightbearing for mobility in regards to the shoulder to tolerance.  No sling necessary.  He should be touchdown weightbearing regards to the right lower extremity in his [place] locked straight."  The risks benefits and alternatives were discussed with the patient including but not limited to the risks of nonoperative treatment, versus surgical intervention including infection, bleeding, nerve injury,  blood clots, cardiopulmonary complications, morbidity, mortality, among others, and they were willing to proceed.   The patient acknowledged the explanation, agreed to proceed with the plan and consent was signed.   Operative Plan: Right knee scope with ACL, MCL, PCL reconstruction with possible open reduction internal fixation of the lateral tibial plateau, possible posterior lateral corner reconstruction and possible repair of the patellar tendon with  medial and lateral meniscectomy versus repair Discharge Medications: Standard DVT Prophylaxis: Aspirin Physical Therapy: Outpatient PT Special Discharge needs: [place]. [place], PA-C  10/01/2021 9:30 AM

## 2021-10-01 NOTE — Anesthesia Procedure Notes (Signed)
Anesthesia Regional Block: Adductor canal block   Pre-Anesthetic Checklist: , timeout performed,  Correct Patient, Correct Site, Correct Laterality,  Correct Procedure, Correct Position, site marked,  Risks and benefits discussed,  Surgical consent,  Pre-op evaluation,  At surgeon's request and post-op pain management  Laterality: Right  Prep: chloraprep       Needles:  Injection technique: Single-shot  Needle Type: Echogenic Stimulator Needle     Needle Length: 5cm  Needle Gauge: 22     Additional Needles:   Procedures:, nerve stimulator,,, ultrasound used (permanent image in chart),,    Narrative:  Start time: 10/01/2021 1:05 PM End time: 10/01/2021 1:10 PM Injection made incrementally with aspirations every 5 mL.  Performed by: Personally  Anesthesiologist: Bethena Midget, MD  Additional Notes: Functioning IV was confirmed and monitors were applied.  A 44mm 22ga Arrow echogenic stimulator needle was used. Sterile prep and drape,hand hygiene and sterile gloves were used. Ultrasound guidance: relevant anatomy identified, needle position confirmed, local anesthetic spread visualized around nerve(s)., vascular puncture avoided.  Image printed for medical record. Negative aspiration and negative test dose prior to incremental administration of local anesthetic. The patient tolerated the procedure well.

## 2021-10-01 NOTE — Transfer of Care (Addendum)
Immediate Anesthesia Transfer of Care Note  Patient: Vincent Mccarthy  Procedure(s) Performed: KNEE ARTHROSCOPY WITH MEDIAL MENISECTOMY (Right: Knee) KNEE ARTHROSCOPY WITH LATERAL MENISECTOMY (Right: Knee) ANTERIOR CRUCIATE LIGAMENT (ACL) REPAIR (Right: Knee) KNEE ARTHROSCOPY WITH POSTERIOR CRUCIATE LIGAMENT (PCL) RECONSTRUCTION (Right: Knee) RECONSTRUCTION MEDIAL COLLATERAL LIGAMENT (MCL) (Right: Knee) PATELLA TENDON REPAIR (Right: Knee)  Patient Location: PACU  Anesthesia Type:General  Level of Consciousness: drowsy  Airway & Oxygen Therapy: Patient Spontanous Breathing and Patient connected to face mask oxygen  Post-op Assessment: Report given to RN and Post -op Vital signs reviewed and stable  Post vital signs: Reviewed and stable  Last Vitals:  Vitals Value Taken Time  BP 131/84 10/01/21 1725  Temp    Pulse 78 10/01/21 1729  Resp 17 10/01/21 1729  SpO2 99 % 10/01/21 1729  Vitals shown include unvalidated device data.  Last Pain:  Vitals:   10/01/21 1024  TempSrc: Oral         Complications: No notable events documented.

## 2021-10-01 NOTE — Interval H&P Note (Signed)
All questions answered. We will proceed with knee surgery today. Rotator cuff will be done at a later date.

## 2021-10-01 NOTE — Anesthesia Preprocedure Evaluation (Addendum)
Anesthesia Evaluation  Patient identified by MRN, date of birth, ID band Patient awake    Reviewed: Allergy & Precautions, H&P , NPO status , Patient's Chart, lab work & pertinent test results, reviewed documented beta blocker date and time   Airway Mallampati: II  TM Distance: >3 FB Neck ROM: full    Dental no notable dental hx. (+) Teeth Intact, Dental Advisory Given   Pulmonary neg pulmonary ROS,    Pulmonary exam normal breath sounds clear to auscultation       Cardiovascular Exercise Tolerance: Good negative cardio ROS   Rhythm:regular Rate:Normal     Neuro/Psych negative neurological ROS  negative psych ROS   GI/Hepatic negative GI ROS, Neg liver ROS,   Endo/Other  negative endocrine ROS  Renal/GU negative Renal ROS  negative genitourinary   Musculoskeletal   Abdominal   Peds  Hematology negative hematology ROS (+)   Anesthesia Other Findings   Reproductive/Obstetrics negative OB ROS                            Anesthesia Physical Anesthesia Plan  ASA: 2  Anesthesia Plan: General   Post-op Pain Management: Regional block   Induction: Intravenous  PONV Risk Score and Plan: 2 and Ondansetron, Dexamethasone and Treatment may vary due to age or medical condition  Airway Management Planned: LMA  Additional Equipment: None  Intra-op Plan:   Post-operative Plan: Extubation in OR  Informed Consent: I have reviewed the patients History and Physical, chart, labs and discussed the procedure including the risks, benefits and alternatives for the proposed anesthesia with the patient or authorized representative who has indicated his/her understanding and acceptance.     Dental Advisory Given  Plan Discussed with: CRNA and Anesthesiologist  Anesthesia Plan Comments: ( )        Anesthesia Quick Evaluation

## 2021-10-01 NOTE — Anesthesia Procedure Notes (Signed)
Procedure Name: LMA Insertion Date/Time: 10/01/2021 2:26 PM Performed by: Lovie Chol, CRNA Pre-anesthesia Checklist: Patient identified, Emergency Drugs available, Suction available and Patient being monitored Patient Re-evaluated:Patient Re-evaluated prior to induction Oxygen Delivery Method: Circle System Utilized Preoxygenation: Pre-oxygenation with 100% oxygen Induction Type: IV induction Ventilation: Mask ventilation without difficulty LMA: LMA inserted LMA Size: 4.0 Number of attempts: 1 Airway Equipment and Method: Bite block Placement Confirmation: positive ETCO2 Tube secured with: Tape Dental Injury: Teeth and Oropharynx as per pre-operative assessment

## 2021-10-02 ENCOUNTER — Encounter (HOSPITAL_COMMUNITY): Payer: Self-pay | Admitting: Orthopaedic Surgery

## 2021-10-02 NOTE — Anesthesia Postprocedure Evaluation (Signed)
Anesthesia Post Note  Patient: Vincent Mccarthy  Procedure(s) Performed: KNEE ARTHROSCOPY WITH MEDIAL MENISECTOMY (Right: Knee) KNEE ARTHROSCOPY WITH LATERAL MENISECTOMY (Right: Knee) ANTERIOR CRUCIATE LIGAMENT (ACL) REPAIR (Right: Knee) KNEE ARTHROSCOPY WITH POSTERIOR CRUCIATE LIGAMENT (PCL) RECONSTRUCTION (Right: Knee) RECONSTRUCTION MEDIAL COLLATERAL LIGAMENT (MCL) (Right: Knee) PATELLA TENDON REPAIR (Right: Knee)     Patient location during evaluation: PACU Anesthesia Type: General Level of consciousness: awake and alert Pain management: pain level controlled Vital Signs Assessment: post-procedure vital signs reviewed and stable Respiratory status: spontaneous breathing, nonlabored ventilation, respiratory function stable and patient connected to nasal cannula oxygen Cardiovascular status: blood pressure returned to baseline and stable Postop Assessment: no apparent nausea or vomiting Anesthetic complications: no   No notable events documented.  Last Vitals:  Vitals:   10/01/21 1733 10/01/21 1810  BP:    Pulse: (!) 110   Resp: (!) 21   Temp:  36.9 C  SpO2: 100%     Last Pain:  Vitals:   10/01/21 1810  TempSrc:   PainSc: 2                  Shelton Silvas
